# Patient Record
Sex: Female | Born: 1975 | Hispanic: No | Marital: Single | Smoking: Former smoker
Health system: Southern US, Community
[De-identification: ages and names within clinical notes are randomized; demographics above are authoritative.]

## PROBLEM LIST (undated history)

## (undated) DIAGNOSIS — N920 Excessive and frequent menstruation with regular cycle: Secondary | ICD-10-CM

## (undated) HISTORY — PX: OTHER SURGICAL HISTORY: SHX169

---

## 1999-06-25 ENCOUNTER — Ambulatory Visit (HOSPITAL_COMMUNITY): Admission: RE | Admit: 1999-06-25 | Discharge: 1999-06-25 | Payer: Self-pay | Admitting: *Deleted

## 1999-10-14 ENCOUNTER — Inpatient Hospital Stay (HOSPITAL_COMMUNITY): Admission: AD | Admit: 1999-10-14 | Discharge: 1999-10-14 | Payer: Self-pay | Admitting: *Deleted

## 1999-11-21 ENCOUNTER — Inpatient Hospital Stay (HOSPITAL_COMMUNITY): Admission: AD | Admit: 1999-11-21 | Discharge: 1999-11-23 | Payer: Self-pay | Admitting: Obstetrics & Gynecology

## 2000-02-07 ENCOUNTER — Emergency Department (HOSPITAL_COMMUNITY): Admission: EM | Admit: 2000-02-07 | Discharge: 2000-02-07 | Payer: Self-pay | Admitting: *Deleted

## 2006-07-12 ENCOUNTER — Other Ambulatory Visit: Admission: RE | Admit: 2006-07-12 | Discharge: 2006-07-12 | Payer: Self-pay | Admitting: Gynecology

## 2007-12-14 ENCOUNTER — Encounter: Payer: Self-pay | Admitting: Women's Health

## 2007-12-14 ENCOUNTER — Ambulatory Visit: Payer: Self-pay | Admitting: Women's Health

## 2007-12-14 ENCOUNTER — Other Ambulatory Visit: Admission: RE | Admit: 2007-12-14 | Discharge: 2007-12-14 | Payer: Self-pay | Admitting: Gynecology

## 2008-01-16 ENCOUNTER — Ambulatory Visit: Payer: Self-pay | Admitting: Women's Health

## 2009-01-04 ENCOUNTER — Other Ambulatory Visit: Admission: RE | Admit: 2009-01-04 | Discharge: 2009-01-04 | Payer: Self-pay | Admitting: Gynecology

## 2009-01-04 ENCOUNTER — Ambulatory Visit: Payer: Self-pay | Admitting: Women's Health

## 2009-01-11 ENCOUNTER — Ambulatory Visit: Payer: Self-pay | Admitting: Women's Health

## 2010-01-08 ENCOUNTER — Other Ambulatory Visit
Admission: RE | Admit: 2010-01-08 | Discharge: 2010-01-08 | Payer: Self-pay | Source: Home / Self Care | Admitting: Gynecology

## 2010-01-08 ENCOUNTER — Ambulatory Visit: Payer: Self-pay | Admitting: Women's Health

## 2010-07-22 ENCOUNTER — Ambulatory Visit (HOSPITAL_BASED_OUTPATIENT_CLINIC_OR_DEPARTMENT_OTHER)
Admission: RE | Admit: 2010-07-22 | Discharge: 2010-07-22 | Disposition: A | Discharge status: Home / Self Care | Payer: Managed Care, Other (non HMO) | Source: Ambulatory Visit | Attending: Orthopedic Surgery | Admitting: Orthopedic Surgery

## 2010-07-22 DIAGNOSIS — M6740 Ganglion, unspecified site: Secondary | ICD-10-CM | POA: Insufficient documentation

## 2010-07-22 DIAGNOSIS — Z01812 Encounter for preprocedural laboratory examination: Secondary | ICD-10-CM | POA: Insufficient documentation

## 2010-07-22 LAB — POCT HEMOGLOBIN-HEMACUE: Hemoglobin: 14.4 g/dL (ref 12.0–15.0)

## 2010-07-24 NOTE — Op Note (Signed)
NAMEDIEGO, Tabitha Mcconnell                 ACCOUNT NO.:  [id number]  MEDICAL RECORD NO.:  [id number]  LOCATION:                                 FACILITY:  PHYSICIAN:  Katy Fitch. Danie Hannig, M.D. DATE OF BIRTH:  February 04, 1975  DATE OF PROCEDURE:  07/22/2010 DATE OF DISCHARGE:                              OPERATIVE REPORT   PREOPERATIVE DIAGNOSIS:  Complex dorsal myxoid cyst, right wrist.  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  OPERATION:  Resection of complex myxoid cyst, dorsal aspect of right wrist with removal of extra retinacular component, sub retinacular component, and intra-articular component.  OPERATING SURGEON:  Katy Fitch. Iara Monds, MD  ASSISTANT:  Marveen Reeks Dasnoit, PA-C  ANESTHESIA:  General by LMA.  SUPERVISING ANESTHESIOLOGIST:  Janetta Hora. Gelene Mink, MD  INDICATIONS:  Tabitha Mcconnell is a 35 year old woman employed by [employer].  She presented for evaluation and management of a mass on dorsal aspect of the wrist, it was causing discomfort.  She appeared to have a relatively soft myxoid cyst that was both extra retinacular and sub retinacular.  We advised observation versus excision.  She requested excision.  Preoperatively, she was carefully counseled with her mother present that we could not guarantee that she would never have another cyst develop in this area.  These are degenerative in nature and can reoccur despite very thorough debridement.  After informed consent, she is brought to the operating room at this time.  PROCEDURE:  Tabitha Mcconnell is brought to room 6 [facility] and placed in a supine position on the operating table.  Following the induction of general anesthesia by LMA technique, the right arm was prepped with Betadine soap solution, sterilely draped.  A pneumatic tourniquet was applied to the proximal right brachium.  On exsanguination of the right arm with  Esmarch bandage, arterial tourniquet was inflated to 220 mmHg.  Procedure commenced with a routine surgical time-out.  A transverse incision was fashioned directly over the mass.  Subcutaneous tissues were carefully divided taking care to carefully protect the dorsal radial sensory branches.  The cyst was noted to be exiting between the fibers of the extensor retinaculum. These were split in line of its fibers and the cyst followed back between the second and fourth dorsal compartments.  The extra-articular portion of the cyst was carefully preserved as we followed the sub retinacular portion down to the dorsal wrist capsule.  The cyst was amputated at the dorsal aspect of the wrist followed by arthrotomy directly over the scapholunate ligament.  A second very significant cyst measuring 1 cm diameter was found intra-articular.  This was essentially a bilobed cyst with a long component dissecting along the vein through the capsule and retinaculum and second component within the wrist joint.  The scapholunate ligament was cleaned with a rongeur.  No mass or other predicaments were noted.  The region was carefully inspected and palpated looking for other lobes of the cyst.  None were identified.  The wound was then repaired in layers with 4-0 Vicryl subcutaneous suture and intradermal 3-0 Prolene with Steri-Strips.  2% lidocaine was infiltrated for postop analgesia.  For aftercare, Tabitha Mcconnell is provided prescription for Vicodin 5 mg one p.o. q.4-6 h. p.r.n. pain, 20 tablets without refill.  We will see Tabitha Mcconnell back for followup in our office in 1 week for dressing change, suture removal, and advancement to a postoperative rehab program.     Katy Fitch. Chon Buhl, M.D.     RVS/MEDQ  D:  07/22/2010  T:  07/23/2010  Job:  [id number]  Electronically Signed by Josephine Igo M.D. on 07/24/2010 02:09:02 PM

## 2011-01-08 ENCOUNTER — Encounter: Payer: Self-pay | Admitting: Anesthesiology

## 2011-01-14 ENCOUNTER — Encounter: Payer: Managed Care, Other (non HMO) | Admitting: Women's Health

## 2011-02-02 ENCOUNTER — Ambulatory Visit (INDEPENDENT_AMBULATORY_CARE_PROVIDER_SITE_OTHER): Payer: Managed Care, Other (non HMO) | Admitting: Women's Health

## 2011-02-02 ENCOUNTER — Encounter: Payer: Self-pay | Admitting: Women's Health

## 2011-02-02 VITALS — BP 124/80 | Ht 61.0 in | Wt 199.0 lb

## 2011-02-02 DIAGNOSIS — Z833 Family history of diabetes mellitus: Secondary | ICD-10-CM

## 2011-02-02 DIAGNOSIS — Z01419 Encounter for gynecological examination (general) (routine) without abnormal findings: Secondary | ICD-10-CM

## 2011-02-02 DIAGNOSIS — Z13220 Encounter for screening for lipoid disorders: Secondary | ICD-10-CM

## 2011-02-02 DIAGNOSIS — Z23 Encounter for immunization: Secondary | ICD-10-CM

## 2011-02-02 DIAGNOSIS — IMO0001 Reserved for inherently not codable concepts without codable children: Secondary | ICD-10-CM

## 2011-02-02 DIAGNOSIS — Z309 Encounter for contraceptive management, unspecified: Secondary | ICD-10-CM

## 2011-02-02 LAB — URINALYSIS
Bilirubin Urine: NEGATIVE
Glucose, UA: NEGATIVE mg/dL
Leukocytes, UA: NEGATIVE
Protein, ur: NEGATIVE mg/dL
pH: 5.5 (ref 5.0–8.0)

## 2011-02-02 LAB — LIPID PANEL
Cholesterol: 156 mg/dL (ref 0–200)
Total CHOL/HDL Ratio: 3.5 Ratio

## 2011-02-02 LAB — GLUCOSE, RANDOM: Glucose, Bld: 81 mg/dL (ref 70–99)

## 2011-02-02 MED ORDER — MEDROXYPROGESTERONE ACETATE 150 MG/ML IM SUSP: 150.0000 mg | INTRAMUSCULAR | Status: DC

## 2011-02-02 NOTE — Progress Notes (Signed)
Tabitha Mcconnell 37/12/1975 [id number]    History:    The patient presents for annual exam.  Amenorrheic on Depo-Provera without complaint/same partner. Normal DEXA in 2009. Quit smoking October 2012. History of all normal Paps.   Past medical history, past surgical history, family history and social history were all reviewed and documented in the EPIC chart. 37 year old son Heloise Purpura, doing well.  ROS:  A  ROS was performed and pertinent positives and negatives are included in the history.  Exam:  Filed Vitals:   02/02/11 1011  BP: 124/80    General appearance:  Normal Head/Neck:  Normal, without cervical or supraclavicular adenopathy. Thyroid:  Symmetrical, normal in size, without palpable masses or nodularity. Respiratory  Effort:  Normal  Auscultation:  Clear without wheezing or rhonchi Cardiovascular  Auscultation:  Regular rate, without rubs, murmurs or gallops  Edema/varicosities:  Not grossly evident Abdominal  Soft,nontender, without masses, guarding or rebound.  Liver/spleen:  No organomegaly noted  Hernia:  None appreciated  Skin  Inspection:  Grossly normal  Palpation:  Grossly normal Neurologic/psychiatric  Orientation:  Normal with appropriate conversation.  Mood/affect:  Normal  Genitourinary    Breasts: Examined lying and sitting.     Right: Without masses, retractions, discharge or axillary adenopathy.     Left: Without masses, retractions, discharge or axillary adenopathy.   Inguinal/mons:  Normal without inguinal adenopathy  External genitalia:  Normal  BUS/Urethra/Skene's glands:  Normal  Bladder:  Normal  Vagina:  Normal  Cervix:  Normal  Uterus:   normal in size, shape and contour.  Midline and mobile  Adnexa/parametria:     Rt: Without masses or tenderness.   Lt: Without masses or tenderness.  Anus and perineum: Normal  Digital rectal exam: Normal sphincter tone without palpated masses or tenderness  Assessment/Plan:  36 y.o. SBF G1 P1 for annual  exam.   Amenorrheic on Depo-Provera Obese  Plan: Denied request for phentermine. Encouraged Weight Watchers for weight loss, nutrition counseling, and to increase exercise decrease calories for weight loss. SBEs, calcium rich diet, MVI daily. Depo-Provera 150 every 12 weeks IM, prescription, proper use, reviewed importance of a calcium rich diet. CBC, glucose, lipid profile, UA. Please vaccine given   Harrington Challenger [facility], 12:27 PM 02/02/2011

## 2011-02-03 LAB — CBC WITH DIFFERENTIAL/PLATELET
Basophils Absolute: 0 10*3/uL (ref 0.0–0.1)
Basophils Relative: 1 % (ref 0–1)
Eosinophils Absolute: 0.1 10*3/uL (ref 0.0–0.7)
Eosinophils Relative: 1 % (ref 0–5)
HCT: 42.9 % (ref 36.0–46.0)
Hemoglobin: 14.2 g/dL (ref 12.0–15.0)
Lymphocytes Relative: 36 % (ref 12–46)
Lymphs Abs: 2.7 10*3/uL (ref 0.7–4.0)
MCH: 29.6 pg (ref 26.0–34.0)
MCHC: 33.1 g/dL (ref 30.0–36.0)
MCV: 89.4 fL (ref 78.0–100.0)
Monocytes Absolute: 0.7 10*3/uL (ref 0.1–1.0)
Monocytes Relative: 9 % (ref 3–12)
Neutro Abs: 4 10*3/uL (ref 1.7–7.7)
Neutrophils Relative %: 53 % (ref 43–77)
Platelets: 293 10*3/uL (ref 150–400)
RBC: 4.8 MIL/uL (ref 3.87–5.11)
RDW: 13.8 % (ref 11.5–15.5)
WBC: 7.6 10*3/uL (ref 4.0–10.5)

## 2011-02-08 ENCOUNTER — Other Ambulatory Visit: Payer: Self-pay | Admitting: Women's Health

## 2011-11-12 ENCOUNTER — Emergency Department (HOSPITAL_COMMUNITY): Payer: Managed Care, Other (non HMO)

## 2011-11-12 ENCOUNTER — Emergency Department (HOSPITAL_COMMUNITY)
Admission: EM | Admit: 2011-11-12 | Discharge: 2011-11-12 | Disposition: A | Discharge status: Home / Self Care | Payer: Managed Care, Other (non HMO) | Attending: Emergency Medicine | Admitting: Emergency Medicine

## 2011-11-12 ENCOUNTER — Encounter (HOSPITAL_COMMUNITY): Payer: Self-pay | Admitting: Emergency Medicine

## 2011-11-12 ENCOUNTER — Telehealth: Payer: Self-pay | Admitting: Women's Health

## 2011-11-12 DIAGNOSIS — K8020 Calculus of gallbladder without cholecystitis without obstruction: Secondary | ICD-10-CM

## 2011-11-12 DIAGNOSIS — Z87891 Personal history of nicotine dependence: Secondary | ICD-10-CM | POA: Insufficient documentation

## 2011-11-12 DIAGNOSIS — R1011 Right upper quadrant pain: Secondary | ICD-10-CM | POA: Insufficient documentation

## 2011-11-12 LAB — COMPREHENSIVE METABOLIC PANEL
ALT: 18 U/L (ref 0–35)
Alkaline Phosphatase: 126 U/L — ABNORMAL HIGH (ref 39–117)
CO2: 24 mEq/L (ref 19–32)
Calcium: 9.5 mg/dL (ref 8.4–10.5)
GFR calc Af Amer: >90 mL/min (ref >90)
GFR calc non Af Amer: >90 mL/min (ref >90)
Glucose, Bld: 117 mg/dL — ABNORMAL HIGH (ref 70–99)
Potassium: 3.8 mEq/L (ref 3.5–5.1)
Sodium: 139 mEq/L (ref 135–145)

## 2011-11-12 LAB — URINALYSIS, ROUTINE W REFLEX MICROSCOPIC
Glucose, UA: NEGATIVE mg/dL
Nitrite: NEGATIVE
Specific Gravity, Urine: 1.02 (ref 1.005–1.030)
pH: 8.5 — ABNORMAL HIGH (ref 5.0–8.0)

## 2011-11-12 LAB — CBC WITH DIFFERENTIAL/PLATELET
Eosinophils Relative: 1 % (ref 0–5)
HCT: 41.3 % (ref 36.0–46.0)
Hemoglobin: 14.4 g/dL (ref 12.0–15.0)
Lymphocytes Relative: 24 % (ref 12–46)
Lymphs Abs: 2.4 10*3/uL (ref 0.7–4.0)
MCV: 87.9 fL (ref 78.0–100.0)
Platelets: 281 10*3/uL (ref 150–400)
RBC: 4.7 MIL/uL (ref 3.87–5.11)
WBC: 10.2 10*3/uL (ref 4.0–10.5)

## 2011-11-12 MED ORDER — HYDROCODONE-ACETAMINOPHEN 5-500 MG PO TABS: 1.0000 | ORAL_TABLET | Freq: Four times a day (QID) | ORAL | Status: DC | PRN

## 2011-11-12 NOTE — ED Notes (Signed)
PT. REPORTS RUQ PAIN ONSET LAST NIGHT , DENIES NAUSEA/VOMITTING OR DIARRHEA , NO FEVER OR CHILLS.

## 2011-11-12 NOTE — Telephone Encounter (Signed)
Telephone call to patient - had been seen in the ER last night for right upper quadrant pain, diagnosed with gallstones. Has scheduled followup with [facility] today.

## 2011-11-12 NOTE — ED Notes (Signed)
Rx given x1 Pt ambulating independently w/ steady gait on d/c in no acute distress, A&Ox4. D/c instructions reviewed w/ pt - pt denies any further questions or concerns at present.   

## 2011-11-12 NOTE — ED Provider Notes (Signed)
History     CSN: [id number]  Arrival date & time 11/12/11  0011   First MD Initiated Contact with Patient 11/12/11 0122      Chief Complaint  Patient presents with  . Abdominal Pain    (Consider location/radiation/quality/duration/timing/severity/associated sxs/prior treatment) Patient is a 36 y.o. female presenting with abdominal pain. The history is provided by the patient.  Abdominal Pain The primary symptoms of the illness include abdominal pain. The primary symptoms of the illness do not include fever, shortness of breath, nausea, vomiting, diarrhea or dysuria. The current episode started yesterday. The onset of the illness was gradual. The problem has been gradually worsening.  The abdominal pain is located in the RUQ. The abdominal pain does not radiate. The severity of the abdominal pain is 6/10. The abdominal pain is relieved by nothing. Exacerbated by: lying down.  Associated with: no alcohol or nsaid use. Additional symptoms associated with the illness include anorexia. Symptoms associated with the illness do not include chills, constipation, urgency, frequency or back pain. Significant associated medical issues do not include gallstones.    Past Medical History  Diagnosis Date  . Smoker     3-4 cig a day QUIT  10/12    Past Surgical History  Procedure Date  . Wrist surgery 06-2010    CYST REMOVED FROM RIGHT WRIST    Family History  Problem Relation Age of Onset  . Heart disease Father   . Breast cancer Paternal Aunt     died in 79's  . Cancer Paternal Uncle     deceased    History  Substance Use Topics  . Smoking status: Former Smoker -- 0.2 packs/day    Types: Cigarettes  . Smokeless tobacco: Never Used  . Alcohol Use: Yes     socially    OB History    Grav Para Term Preterm Abortions TAB SAB Ect Mult Living   1 1        1       Review of Systems  Constitutional: Negative for fever and chills.  Respiratory: Negative for shortness of breath.     Gastrointestinal: Positive for abdominal pain and anorexia. Negative for nausea, vomiting, diarrhea and constipation.  Genitourinary: Negative for dysuria, urgency and frequency.  Musculoskeletal: Negative for back pain.  All other systems reviewed and are negative.    Allergies  Review of patient's allergies indicates no known allergies.  Home Medications   Current Outpatient Rx  Name Route Sig Dispense Refill  . MEDROXYPROGESTERONE ACETATE 150 MG/ML IM SUSP Intramuscular Inject 1 mL (150 mg total) into the muscle every 3 (three) months. 1 mL 4  . PHENTERMINE HCL 37.5 MG PO CAPS Oral Take 37.5 mg by mouth daily with breakfast.      BP 133/76  Pulse 98  Temp 98.1 F (36.7 C) (Oral)  Resp 18  SpO2 99%  Physical Exam  Nursing note and vitals reviewed. Constitutional: She is oriented to person, place, and time. She appears well-developed and well-nourished. No distress.  HENT:  Head: Normocephalic and atraumatic.  Mouth/Throat: Oropharynx is clear and moist.  Eyes: Conjunctivae normal and EOM are normal. Pupils are equal, round, and reactive to light.  Neck: Normal range of motion. Neck supple.  Cardiovascular: Normal rate, regular rhythm and intact distal pulses.   No murmur heard. Pulmonary/Chest: Effort normal and breath sounds normal. No respiratory distress. She has no wheezes. She has no rales.  Abdominal: Soft. Normal appearance. She exhibits no distension. There is tenderness  in the right upper quadrant. There is no rebound and no guarding.  Musculoskeletal: Normal range of motion. She exhibits no edema and no tenderness.  Neurological: She is alert and oriented to person, place, and time.  Skin: Skin is warm and dry. No rash noted. No erythema.  Psychiatric: She has a normal mood and affect. Her behavior is normal.    ED Course  Procedures (including critical care time)  Labs Reviewed  URINALYSIS, ROUTINE W REFLEX MICROSCOPIC - Abnormal; Notable for the  following:    APPearance CLOUDY (*)     pH 8.5 (*)     Leukocytes, UA TRACE (*)     All other components within normal limits  COMPREHENSIVE METABOLIC PANEL - Abnormal; Notable for the following:    Glucose, Bld 117 (*)     Alkaline Phosphatase 126 (*)     All other components within normal limits  URINE MICROSCOPIC-ADD ON - Abnormal; Notable for the following:    Squamous Epithelial / LPF FEW (*)     Bacteria, UA FEW (*)     All other components within normal limits  CBC WITH DIFFERENTIAL  POCT PREGNANCY, URINE  LIPASE, BLOOD   US Abdomen Complete  11/12/2011  *RADIOLOGY REPORT*  Clinical Data:  Right upper quadrant pain.  COMPLETE ABDOMINAL ULTRASOUND  Comparison:  None.  Findings:  Gallbladder:  Multiple stones in the gallbladder measuring up to 1.4 cm.  Sludge in the gallbladder.  No significant gallbladder wall thickening.  Ring down artifact consistent with adenomyomatosis.Murphy's sign is negative.  Common bile duct:  Normal caliber with measured diameter of 3.3 mm.  Liver:  No focal lesion identified.  Within normal limits in parenchymal echogenicity.  IVC:  Appears normal.  Pancreas:  Visualized head and body of the pancreas are unremarkable.  Spleen:  Spleen length measures 9.7 cm.  Normal parenchymal echotexture.  Right Kidney:  Right kidney measures 10.2 cm length.  No hydronephrosis.  Left Kidney:  Left kidney measures 11 cm length.  No hydronephrosis.  Abdominal aorta:  No aneurysm identified.  IMPRESSION: Cholelithiasis with gallbladder sludge.  Gallbladder adenomyomatosis.   Original Report Authenticated By: Marlon Pel, M.D.      No diagnosis found.    MDM   Patient with a history of abdominal pain that started 2 days ago. And improved and then returned tonight. She denies any association with eating or any associated symptoms. On exam she has significant right upper quadrant tenderness. Concern for possible cholecystitis. Ultrasound pending. CBC, CMP, lipase, UA  are all within normal limits. Patient denies needing any medication for pain at this time.  3:22 AM On reevaluation patient is completely pain-free without any medications. Ultrasound shows cholelithiasis and gallbladder sludge. This is most likely the cause of her symptoms will get outpatient followup to surgery.       Gwyneth Sprout, MD 11/12/11 [phone]

## 2011-11-12 NOTE — ED Notes (Signed)
Pt back from US

## 2011-11-30 ENCOUNTER — Ambulatory Visit (INDEPENDENT_AMBULATORY_CARE_PROVIDER_SITE_OTHER): Payer: Managed Care, Other (non HMO) | Admitting: Surgery

## 2011-11-30 ENCOUNTER — Encounter (INDEPENDENT_AMBULATORY_CARE_PROVIDER_SITE_OTHER): Payer: Self-pay | Admitting: Surgery

## 2011-11-30 VITALS — BP 124/82 | HR 76 | Temp 97.8°F | Resp 14 | Ht 62.0 in | Wt 198.8 lb

## 2011-11-30 DIAGNOSIS — K8020 Calculus of gallbladder without cholecystitis without obstruction: Secondary | ICD-10-CM | POA: Insufficient documentation

## 2011-11-30 NOTE — Patient Instructions (Signed)
Laparoscopic Cholecystectomy Laparoscopic cholecystectomy is surgery to remove the gallbladder. The gallbladder is located slightly to the right of center in the abdomen, behind the liver. It is a concentrating and storage sac for the bile produced in the liver. Bile aids in the digestion and absorption of fats. Gallbladder disease (cholecystitis) is an inflammation of your gallbladder. This condition is usually caused by a buildup of gallstones (cholelithiasis) in your gallbladder. Gallstones can block the flow of bile, resulting in inflammation and pain. In severe cases, emergency surgery may be required. When emergency surgery is not required, you will have time to prepare for the procedure. Laparoscopic surgery is an alternative to open surgery. Laparoscopic surgery usually has a shorter recovery time. Your common bile duct may also need to be examined and explored. Your caregiver will discuss this with you if he or she feels this should be done. If stones are found in the common bile duct, they may be removed. LET YOUR CAREGIVER KNOW ABOUT:  Allergies to food or medicine.  Medicines taken, including vitamins, herbs, eyedrops, over-the-counter medicines, and creams.  Use of steroids (by mouth or creams).  Previous problems with anesthetics or numbing medicines.  History of bleeding problems or blood clots.  Previous surgery.  Other health problems, including diabetes and kidney problems.  Possibility of pregnancy, if this applies. RISKS AND COMPLICATIONS All surgery is associated with risks. Some problems that may occur following this procedure include:  Infection.  Damage to the common bile duct, nerves, arteries, veins, or other internal organs such as the stomach or intestines.  Bleeding.  A stone may remain in the common bile duct. BEFORE THE PROCEDURE  Do not take aspirin for 3 days prior to surgery or blood thinners for 1 week prior to surgery.  Do not eat or drink  anything after midnight the night before surgery.  Let your caregiver know if you develop a cold or other infectious problem prior to surgery.  You should be present 60 minutes before the procedure or as directed. PROCEDURE  You will be given medicine that makes you sleep (general anesthetic). When you are asleep, your surgeon will make several small cuts (incisions) in your abdomen. One of these incisions is used to insert a small, lighted scope (laparoscope) into the abdomen. The laparoscope helps the surgeon see into your abdomen. Carbon dioxide gas will be pumped into your abdomen. The gas allows more room for the surgeon to perform your surgery. Other operating instruments are inserted through the other incisions. Laparoscopic procedures may not be appropriate when:  There is major scarring from previous surgery.  The gallbladder is extremely inflamed.  There are bleeding disorders or unexpected cirrhosis of the liver.  A pregnancy is near term.  Other conditions make the laparoscopic procedure impossible. If your surgeon feels it is not safe to continue with a laparoscopic procedure, he or she will perform an open abdominal procedure. In this case, the surgeon will make an incision to open the abdomen. This gives the surgeon a larger view and field to work within. This may allow the surgeon to perform procedures that sometimes cannot be performed with a laparoscope alone. Open surgery has a longer recovery time. AFTER THE PROCEDURE  You will be taken to the recovery area where a nurse will watch and check your progress.  You may be allowed to go home the same day.  Do not resume physical activities until directed by your caregiver.  You may resume a normal diet and   activities as directed. Document Released: 01/12/2005 Document Revised: 04/06/2011 Document Reviewed: 06/27/2010 [facility] Patient Information 2013 [place], [place].  Cholelithiasis Cholelithiasis (also called  gallstones) is a form of gallbladder disease where gallstones form in your gallbladder. The gallbladder is a non-essential organ that stores bile made in the liver, which helps digest fats. Gallstones begin as small crystals and slowly grow into stones. Gallstone pain occurs when the gallbladder spasms, and a gallstone is blocking the duct. Pain can also occur when a stone passes out of the duct.  Women are more likely to develop gallstones than men. Other factors that increase the risk of gallbladder disease are:  Having multiple pregnancies. Physicians sometimes advise removing diseased gallbladders before future pregnancies.  Obesity.  Diets heavy in fried foods and fat.  Increasing age (older than 14).  Prolonged use of medications containing female hormones.  Diabetes mellitus.  Rapid weight loss.  Family history of gallstones (heredity). SYMPTOMS  Feeling sick to your stomach (nauseous).  Abdominal pain.  Yellowing of the skin (jaundice).  Sudden pain. It may persist from several minutes to several hours.  Worsening pain with deep breathing or when jarred.  Fever.  Tenderness to the touch. In some cases, when gallstones do not move into the bile duct, people have no pain or symptoms. These are called "silent" gallstones. TREATMENT In severe cases, emergency surgery may be required. HOME CARE INSTRUCTIONS   Only take over-the-counter or prescription medicines for pain, discomfort, or fever as directed by your caregiver.  Follow a low-fat diet until seen again. Fat causes the gallbladder to contract, which can result in pain.  Follow up as instructed. Attacks are almost always recurrent and surgery is usually required for permanent treatment. SEEK IMMEDIATE MEDICAL CARE IF:   Your pain increases and is not controlled by medications.  You have an oral temperature above 102 F (38.9 C), not controlled by medication.  You develop nausea and vomiting. MAKE SURE YOU:    Understand these instructions.  Will watch your condition.  Will get help right away if you are not doing well or get worse. Document Released: 01/08/2005 Document Revised: 04/06/2011 Document Reviewed: 03/13/2010 [facility] Patient Information 2013 [place], [place]. Laparoscopic Cholecystectomy Care After These instructions give you information on caring for yourself after your procedure. Your doctor may also give you more specific instructions. Call your doctor if you have any problems or questions after your procedure. HOME CARE  Change your bandages (dressings) as told by your doctor.  Keep the wound dry and clean. Wash the wound gently with soap and water. Pat the wound dry with a clean towel.  Do not take baths, swim, or use hot tubs for 10 days, or as told by your doctor.  Only take medicine as told by your doctor.  Eat a normal diet as told by your doctor.  Do not lift anything heavier than 25 pounds (11.5 kg), or as told by your doctor.  Do not play contact sports for 1 week, or as told by your doctor. GET HELP RIGHT AWAY IF:   Your wound is red, puffy (swollen), or painful.  You have yellowish-white fluid (pus) coming from the wound.  You have fluid draining from the wound for more than 1 day.  You have a bad smell coming from the wound.  Your wound breaks open.  You have a rash.  You have trouble breathing.  You have chest pain.  You have a bad reaction to your medicine.  You have a  fever.  You have pain in the shoulders (shoulder strap areas).  You feel dizzy or pass out (faint).  You have severe belly (abdominal) pain.  You feel sick to your stomach (nauseous) or throw up (vomit) for more than 1 day. MAKE SURE YOU:  Understand these instructions.  Will watch your condition.  Will get help right away if you are not doing well or get worse. Document Released: 10/22/2007 Document Revised: 04/06/2011 Document Reviewed: 07/01/2010 [facility]  Patient Information 2013 [place], [place].

## 2011-11-30 NOTE — Progress Notes (Signed)
Patient ID: Tabitha Mcconnell, female   DOB: 01-28-1975, 36 y.o.   MRN: [id number]  No chief complaint on file.   HPI Tabitha Mcconnell is a 36 y.o. female.  Patient sent at the request of Dr. Anitra Lauth for right upper quadrant abdominal pain. She was seen 2 weeks ago in the emergency room. She had a one-day history of right upper quadrant abdominal pain. This came on suddenly. This was severe. This was sharp in nature. Radiation to her back. No dietary trigger. Ultrasound showed gallstones. HPI  Past Medical History  Diagnosis Date  . Smoker     3-4 cig a day QUIT  10/12    Past Surgical History  Procedure Date  . Wrist surgery 06-2010    CYST REMOVED FROM RIGHT WRIST    Family History  Problem Relation Age of Onset  . Heart disease Father   . Breast cancer Paternal Aunt     died in 85's  . Cancer Paternal Uncle     deceased  . Lymphoma Cousin     Social History History  Substance Use Topics  . Smoking status: Former Smoker -- 0.2 packs/day    Types: Cigarettes  . Smokeless tobacco: Never Used  . Alcohol Use: Yes     Comment: socially    No Known Allergies  Current Outpatient Prescriptions  Medication Sig Dispense Refill  . HYDROcodone-acetaminophen (VICODIN) 5-500 MG per tablet Take 1-2 tablets by mouth every 6 (six) hours as needed for pain.  15 tablet  0  . medroxyPROGESTERone (DEPO-PROVERA) 150 MG/ML injection Inject 1 mL (150 mg total) into the muscle every 3 (three) months.  1 mL  4  . phentermine 37.5 MG capsule Take 37.5 mg by mouth daily with breakfast.        Review of Systems Review of Systems  Constitutional: Negative for fever, chills and unexpected weight change.  HENT: Negative for hearing loss, congestion, sore throat, trouble swallowing and voice change.   Eyes: Negative for visual disturbance.  Respiratory: Negative for cough and wheezing.   Cardiovascular: Negative for chest pain, palpitations and leg swelling.  Gastrointestinal: Negative for nausea,  vomiting, abdominal pain, diarrhea, constipation, blood in stool, abdominal distention and anal bleeding.  Genitourinary: Negative for hematuria, vaginal bleeding and difficulty urinating.  Musculoskeletal: Negative for arthralgias.  Skin: Negative for rash and wound.  Neurological: Negative for seizures, syncope and headaches.  Hematological: Negative for adenopathy. Does not bruise/bleed easily.  Psychiatric/Behavioral: Negative for confusion.    Blood pressure 124/82, pulse 76, temperature 97.8 F (36.6 C), resp. rate 14, height 5\' 2"  (1.575 m), weight 198 lb 12.8 oz (90.175 kg).  Physical Exam Physical Exam  Constitutional: She is oriented to person, place, and time. She appears well-developed and well-nourished.  HENT:  Head: Normocephalic and atraumatic.  Eyes: EOM are normal. Pupils are equal, round, and reactive to light.  Neck: Normal range of motion. Neck supple.  Cardiovascular: Normal rate and regular rhythm.   Pulmonary/Chest: Effort normal and breath sounds normal.  Abdominal: Soft. Bowel sounds are normal. She exhibits no distension. There is no tenderness. There is no rebound and no guarding.  Musculoskeletal: Normal range of motion.  Neurological: She is alert and oriented to person, place, and time.  Skin: Skin is warm and dry.  Psychiatric: She has a normal mood and affect. Her behavior is normal. Judgment and thought content normal.    Data Reviewed *RADIOLOGY REPORT*  Clinical Data: Right upper quadrant pain.  COMPLETE ABDOMINAL  ULTRASOUND  Comparison: None.  Findings:  Gallbladder: Multiple stones in the gallbladder measuring up to  1.4 cm. Sludge in the gallbladder. No significant gallbladder  wall thickening. Ring down artifact consistent with  adenomyomatosis.Murphy's sign is negative.  Common bile duct: Normal caliber with measured diameter of 3.3 mm.  Liver: No focal lesion identified. Within normal limits in  parenchymal echogenicity.  IVC: Appears  normal.  Pancreas: Visualized head and body of the pancreas are  unremarkable.  Spleen: Spleen length measures 9.7 cm. Normal parenchymal  echotexture.  Right Kidney: Right kidney measures 10.2 cm length. No  hydronephrosis.  Left Kidney: Left kidney measures 11 cm length. No  hydronephrosis.  Abdominal aorta: No aneurysm identified.  IMPRESSION:  Cholelithiasis with gallbladder sludge. Gallbladder  adenomyomatosis.  Original Report Authenticated By: Marlon Pel, M.D.    Assessment    Symptomatic cholelithiasis    Plan    Laparoscopic cholecystectomy with cholangiogram.The procedure has been discussed with the patient. Operative and non operative treatments have been discussed. Risks of surgery include bleeding, infection,  Common bile duct injury,  Injury to the stomach,liver, colon,small intestine, abdominal wall,  Diaphragm,  Major blood vessels,  And the need for an open procedure.  Other risks include worsening of medical problems, death,  DVT and pulmonary embolism, and cardiovascular events.   Medical options have also been discussed. The patient has been informed of long term expectations of surgery and non surgical options,  The patient agrees to proceed.          Hence Derrick A. 11/30/2011, 10:27 AM

## 2011-12-27 HISTORY — PX: LAPAROSCOPIC CHOLECYSTECTOMY: SUR755

## 2012-01-07 ENCOUNTER — Other Ambulatory Visit (INDEPENDENT_AMBULATORY_CARE_PROVIDER_SITE_OTHER): Payer: Self-pay | Admitting: Surgery

## 2012-01-07 ENCOUNTER — Ambulatory Visit
Admission: RE | Admit: 2012-01-07 | Discharge: 2012-01-07 | Disposition: A | Discharge status: Home / Self Care | Payer: Managed Care, Other (non HMO) | Source: Ambulatory Visit | Attending: Surgery | Admitting: Surgery

## 2012-01-07 DIAGNOSIS — Z01811 Encounter for preprocedural respiratory examination: Secondary | ICD-10-CM

## 2012-01-12 ENCOUNTER — Other Ambulatory Visit (INDEPENDENT_AMBULATORY_CARE_PROVIDER_SITE_OTHER): Payer: Self-pay | Admitting: Surgery

## 2012-01-12 DIAGNOSIS — K8010 Calculus of gallbladder with chronic cholecystitis without obstruction: Secondary | ICD-10-CM

## 2012-01-29 ENCOUNTER — Encounter (INDEPENDENT_AMBULATORY_CARE_PROVIDER_SITE_OTHER): Payer: Self-pay | Admitting: Surgery

## 2012-01-29 ENCOUNTER — Ambulatory Visit (INDEPENDENT_AMBULATORY_CARE_PROVIDER_SITE_OTHER): Payer: Managed Care, Other (non HMO) | Admitting: Surgery

## 2012-01-29 VITALS — BP 124/84 | HR 80 | Temp 98.3°F | Resp 14 | Ht 62.0 in | Wt 204.8 lb

## 2012-01-29 DIAGNOSIS — Z9889 Other specified postprocedural states: Secondary | ICD-10-CM

## 2012-01-29 NOTE — Progress Notes (Signed)
she is here for a postop visit following laparoscopic cholecystectomy.  Diet is being tolerated, bowels are moving.  No problems with incisions.  PE:  ABD:  Soft, incisions clean/dry/intact and solid.  Assessment:  Doing well postop.  Plan:  Lowfat diet recommended.  Activities as tolerated.  Return visit prn. 

## 2012-01-29 NOTE — Patient Instructions (Signed)
Return to full activity.  

## 2012-02-04 ENCOUNTER — Ambulatory Visit (INDEPENDENT_AMBULATORY_CARE_PROVIDER_SITE_OTHER): Payer: Managed Care, Other (non HMO) | Admitting: Women's Health

## 2012-02-04 ENCOUNTER — Encounter: Payer: Self-pay | Admitting: Women's Health

## 2012-02-04 VITALS — BP 120/82 | Ht 61.0 in | Wt 191.0 lb

## 2012-02-04 DIAGNOSIS — IMO0001 Reserved for inherently not codable concepts without codable children: Secondary | ICD-10-CM

## 2012-02-04 DIAGNOSIS — K8020 Calculus of gallbladder without cholecystitis without obstruction: Secondary | ICD-10-CM

## 2012-02-04 DIAGNOSIS — Z309 Encounter for contraceptive management, unspecified: Secondary | ICD-10-CM

## 2012-02-04 DIAGNOSIS — Z01419 Encounter for gynecological examination (general) (routine) without abnormal findings: Secondary | ICD-10-CM

## 2012-02-04 MED ORDER — MEDROXYPROGESTERONE ACETATE 150 MG/ML IM SUSP: INTRAMUSCULAR | Status: DC

## 2012-02-04 NOTE — Patient Instructions (Addendum)

## 2012-02-04 NOTE — Assessment & Plan Note (Signed)
Cholecystectomy 12/2011

## 2012-02-04 NOTE — Progress Notes (Signed)
Tabitha Mcconnell 03/20/1975 [id number]    History:    The patient presents for annual exam.  Amenorrheic on Depo-Provera. History of normal Paps. Cholecystectomy Dr. Luisa Hart - 12/2011, returning to work next week, doing well.   Past medical history, past surgical history, family history and social history were all reviewed and documented in the EPIC chart. Works at [employer]. Quit smoking October 2012. Normal bone density 2009. Tabitha Mcconnell 11 doing well.   ROS:  A  ROS was performed and pertinent positives and negatives are included in the history.  Exam:  Filed Vitals:   02/04/12 0807  BP: 120/82    General appearance:  Normal Head/Neck:  Normal, without cervical or supraclavicular adenopathy. Thyroid:  Symmetrical, normal in size, without palpable masses or nodularity. Respiratory  Effort:  Normal  Auscultation:  Clear without wheezing or rhonchi Cardiovascular  Auscultation:  Regular rate, without rubs, murmurs or gallops  Edema/varicosities:  Not grossly evident Abdominal  Soft,nontender, without masses, guarding or rebound.  Liver/spleen:  No organomegaly noted  Hernia:  None appreciated  Skin  Inspection:  Grossly normal  Palpation:  Grossly normal Neurologic/psychiatric  Orientation:  Normal with appropriate conversation.  Mood/affect:  Normal  Genitourinary    Breasts: Examined lying and sitting.     Right: Without masses, retractions, discharge or axillary adenopathy.     Left: Without masses, retractions, discharge or axillary adenopathy.   Inguinal/mons:  Normal without inguinal adenopathy  External genitalia:  Normal  BUS/Urethra/Skene's glands:  Normal  Bladder:  Normal  Vagina:  Normal  Cervix:  Normal  Uterus:  normal in size, shape and contour.  Midline and mobile  Adnexa/parametria:     Rt: Without masses or tenderness.   Lt: Without masses or tenderness.  Anus and perineum: Normal  Digital rectal exam: Normal sphincter tone without palpated  masses or tenderness  Assessment/Plan:  37 y.o. SBF G1 P1 for annual exam without complaint.  Normal GYN exam/amenorrheic on Depo-Provera since 2001 Status post cholecystectomy 12/2011 doing well Obesity  Plan: Depo-Provera 150 IM every 12 weeks, prescription, proper use given and reviewed, neighbor who is a Technical brewer. Planning to start Weight [employer]. Reviewed importance of calcium rich diet, vitamin D 1000 daily encouraged, increase regular exercise and decrease calories for weight loss. SBE's encouraged. No labs, normal CBC 12/2011, normal Pap 01/2011, new screening guidelines reviewed. Condoms encouraged if become sexually active.     Harrington Challenger [facility], 8:45 AM 02/04/2012

## 2012-02-15 ENCOUNTER — Encounter: Payer: Self-pay | Admitting: Women's Health

## 2012-03-20 ENCOUNTER — Other Ambulatory Visit: Payer: Self-pay | Admitting: Women's Health

## 2013-02-06 ENCOUNTER — Encounter: Payer: Managed Care, Other (non HMO) | Admitting: Women's Health

## 2013-02-16 ENCOUNTER — Encounter: Payer: Self-pay | Admitting: Women's Health

## 2013-03-02 ENCOUNTER — Ambulatory Visit (INDEPENDENT_AMBULATORY_CARE_PROVIDER_SITE_OTHER): Payer: Managed Care, Other (non HMO) | Admitting: Women's Health

## 2013-03-02 ENCOUNTER — Encounter: Payer: Self-pay | Admitting: Women's Health

## 2013-03-02 VITALS — BP 112/67 | Ht 61.0 in | Wt 214.2 lb

## 2013-03-02 DIAGNOSIS — E079 Disorder of thyroid, unspecified: Secondary | ICD-10-CM

## 2013-03-02 DIAGNOSIS — Z01419 Encounter for gynecological examination (general) (routine) without abnormal findings: Secondary | ICD-10-CM

## 2013-03-02 DIAGNOSIS — Z13220 Encounter for screening for lipoid disorders: Secondary | ICD-10-CM

## 2013-03-02 DIAGNOSIS — Z833 Family history of diabetes mellitus: Secondary | ICD-10-CM

## 2013-03-02 DIAGNOSIS — Z113 Encounter for screening for infections with a predominantly sexual mode of transmission: Secondary | ICD-10-CM

## 2013-03-02 LAB — CBC WITH DIFFERENTIAL/PLATELET
BASOS ABS: 0 10*3/uL (ref 0.0–0.1)
BASOS PCT: 0 % (ref 0–1)
EOS ABS: 0.1 10*3/uL (ref 0.0–0.7)
Eosinophils Relative: 1 % (ref 0–5)
HCT: 40.3 % (ref 36.0–46.0)
Hemoglobin: 14.1 g/dL (ref 12.0–15.0)
Lymphocytes Relative: 40 % (ref 12–46)
Lymphs Abs: 2.9 10*3/uL (ref 0.7–4.0)
MCH: 29.9 pg (ref 26.0–34.0)
MCHC: 35 g/dL (ref 30.0–36.0)
MCV: 85.6 fL (ref 78.0–100.0)
MONOS PCT: 8 % (ref 3–12)
Monocytes Absolute: 0.6 10*3/uL (ref 0.1–1.0)
NEUTROS ABS: 3.7 10*3/uL (ref 1.7–7.7)
NEUTROS PCT: 51 % (ref 43–77)
PLATELETS: 292 10*3/uL (ref 150–400)
RBC: 4.71 MIL/uL (ref 3.87–5.11)
RDW: 13.7 % (ref 11.5–15.5)
WBC: 7.2 10*3/uL (ref 4.0–10.5)

## 2013-03-02 LAB — LIPID PANEL
CHOL/HDL RATIO: 3.9 ratio
CHOLESTEROL: 161 mg/dL (ref 0–200)
HDL: 41 mg/dL (ref >39)
LDL Cholesterol: 103 mg/dL — ABNORMAL HIGH (ref 0–99)
Triglycerides: 83 mg/dL (ref <150)
VLDL: 17 mg/dL (ref 0–40)

## 2013-03-02 LAB — GLUCOSE, RANDOM: GLUCOSE: 74 mg/dL (ref 70–99)

## 2013-03-02 LAB — TSH: TSH: 2.219 u[IU]/mL (ref 0.350–4.500)

## 2013-03-02 MED ORDER — MEDROXYPROGESTERONE ACETATE 150 MG/ML IM SUSP: INTRAMUSCULAR | Status: DC

## 2013-03-02 NOTE — Patient Instructions (Signed)

## 2013-03-02 NOTE — Addendum Note (Signed)
Addended by: Alen Blew on: 03/02/2013 04:19 PM   Modules accepted: Orders

## 2013-03-02 NOTE — Progress Notes (Signed)
Tabitha Mcconnell [birth year] [id number]    History:    Presents for annual exam.  Amenorrheic on Depo-Provera/new partner. Normal Pap history. Normal DEXA 2009.   Past medical history, past surgical history, family history and social history were all reviewed and documented in the EPIC chart. Cholecystectomy 12/2011.Quit smoking 2012. Works at [employer] [place]. [name] 12 doing well.  ROS:  A  ROS was performed and pertinent positives and negatives are included.  Exam:  Filed Vitals:   03/02/13 1426  BP: 112/67    General appearance:  Normal Thyroid:  Symmetrical, normal in size, without palpable masses or nodularity. Respiratory  Auscultation:  Clear without wheezing or rhonchi Cardiovascular  Auscultation:  Regular rate, without rubs, murmurs or gallops  Edema/varicosities:  Not grossly evident Abdominal  Soft,nontender, without masses, guarding or rebound.  Liver/spleen:  No organomegaly noted  Hernia:  None appreciated  Skin  Inspection:  Grossly normal   Breasts: Examined lying and sitting.     Right: Without masses, retractions, discharge or axillary adenopathy.     Left: Without masses, retractions, discharge or axillary adenopathy. Gentitourinary   Inguinal/mons:  Normal without inguinal adenopathy  External genitalia:  Normal  BUS/Urethra/Skene's glands:  Normal  Vagina:  Normal  Cervix:  Normal  Uterus:   normal in size, shape and contour.  Midline and mobile  Adnexa/parametria:     Rt: Without masses or tenderness.   Lt: Without masses or tenderness.  Anus and perineum: Normal  Digital rectal exam: Normal sphincter tone without palpated masses or tenderness  Assessment/Plan:  38 y.o. SBF G1P1 for annual exam with no complaints.  STD screening Obesity  Plan: Depo-Provera 150 prescription given, return to office every 12 weeks for injection. Reviewed importance of calcium rich diet, vitamin D 2000 daily. Annual screening at 5, baseline now reviewed. SBE's,  increase regular exercise and decrease calories for weight loss. CBC, glucose, TSH, lipid panel UA, Pap, Pap normal 12/2009, new screening guidelines reviewed. GC/Chlamydia, HIV, hep B, C., RPRHuel Cote WHNP, 3:09 PM 03/02/2013

## 2013-03-03 ENCOUNTER — Other Ambulatory Visit (HOSPITAL_COMMUNITY)
Admission: RE | Admit: 2013-03-03 | Discharge: 2013-03-03 | Disposition: A | Discharge status: Home / Self Care | Payer: Managed Care, Other (non HMO) | Source: Ambulatory Visit | Attending: Gynecology | Admitting: Gynecology

## 2013-03-03 LAB — RPR

## 2013-03-03 LAB — URINALYSIS W MICROSCOPIC + REFLEX CULTURE
Bilirubin Urine: NEGATIVE
CRYSTALS: NONE SEEN
Casts: NONE SEEN
GLUCOSE, UA: NEGATIVE mg/dL
Hgb urine dipstick: NEGATIVE
Ketones, ur: NEGATIVE mg/dL
Nitrite: NEGATIVE
PROTEIN: NEGATIVE mg/dL
SPECIFIC GRAVITY, URINE: 1.027 (ref 1.005–1.030)
Urobilinogen, UA: 1 mg/dL (ref 0.0–1.0)
pH: 6 (ref 5.0–8.0)

## 2013-03-03 LAB — GC/CHLAMYDIA PROBE AMP
CT PROBE, AMP APTIMA: NEGATIVE
GC PROBE AMP APTIMA: NEGATIVE

## 2013-03-03 LAB — HEPATITIS B SURFACE ANTIGEN: Hepatitis B Surface Ag: NEGATIVE

## 2013-03-03 LAB — URINE CULTURE: Colony Count: 30000

## 2013-03-03 LAB — HEPATITIS C ANTIBODY: HCV AB: NEGATIVE

## 2013-03-03 LAB — HIV ANTIBODY (ROUTINE TESTING W REFLEX): HIV: NONREACTIVE

## 2013-09-04 IMAGING — US US ABDOMEN COMPLETE
1 series · 14 of 25 positions shown · non-contrast
Comparison: None.

CLINICAL DATA: Right upper quadrant pain.

COMPLETE ABDOMINAL ULTRASOUND

[Series 1: us abdomen complete · 0.30mm/px · 14 of 48 slices shown]
[im 1/48]
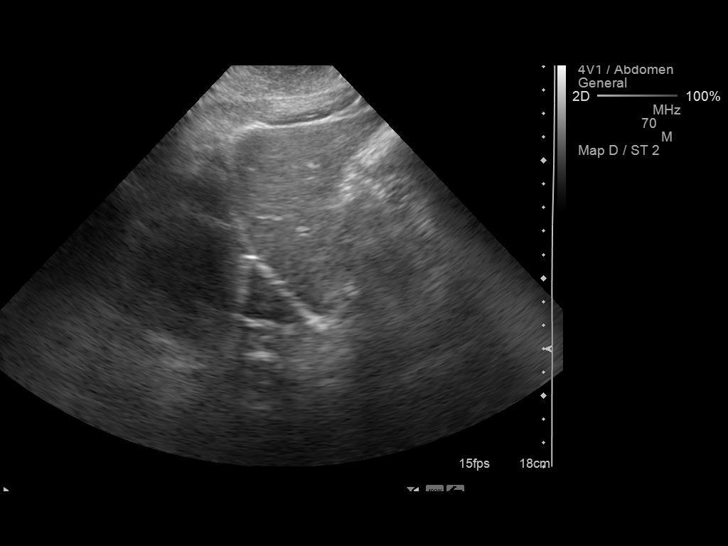
[im 4/48]
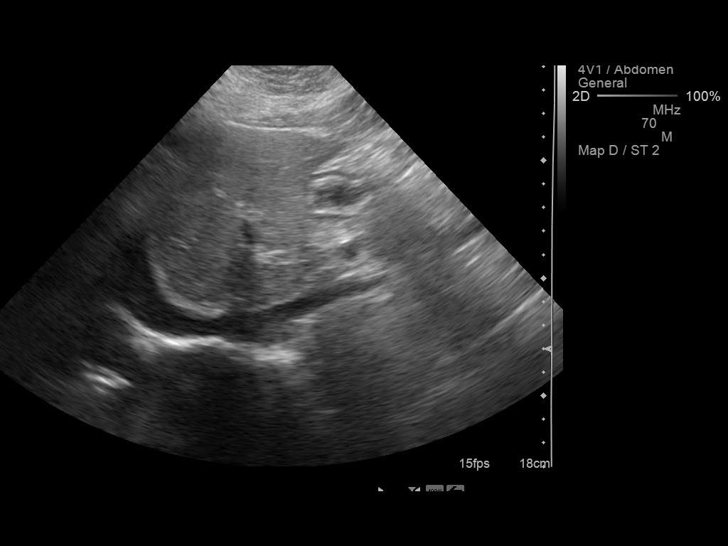
[im 8/48]
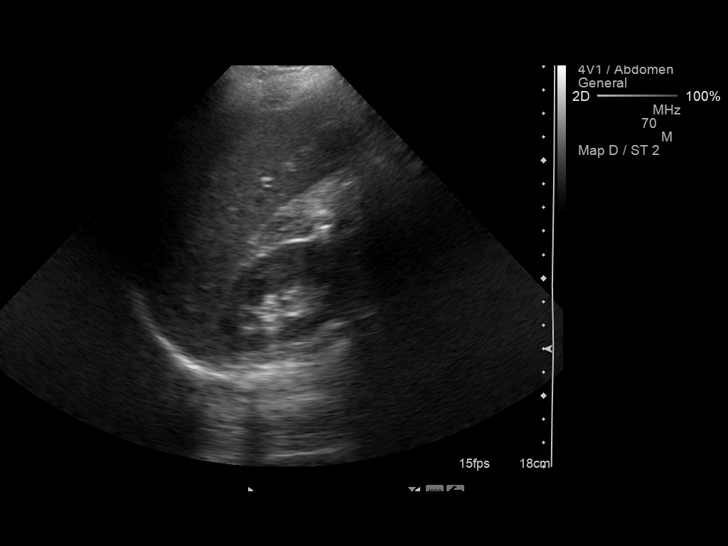
[im 12/48]
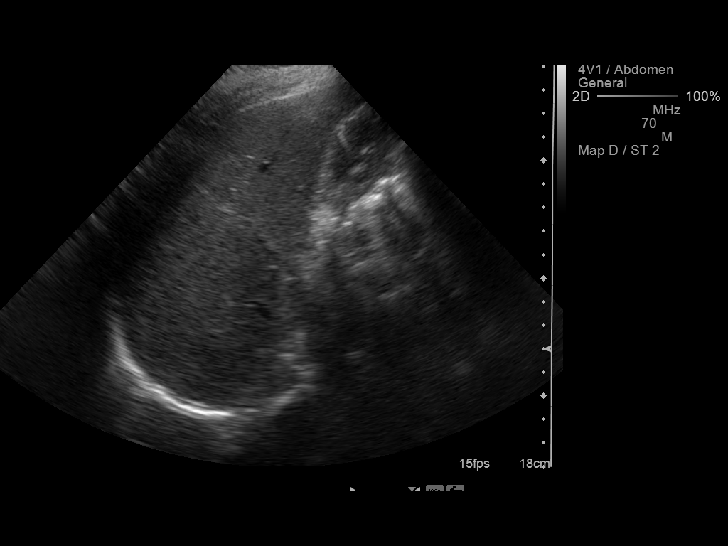
[im 16/48]
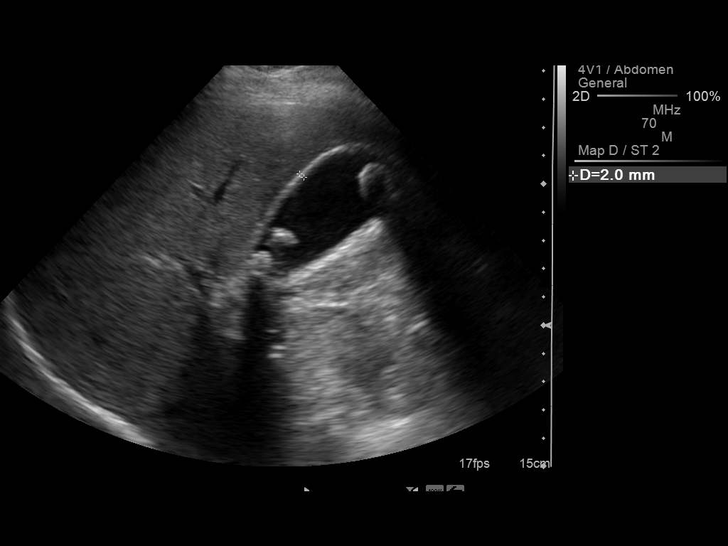
[im 18/48]
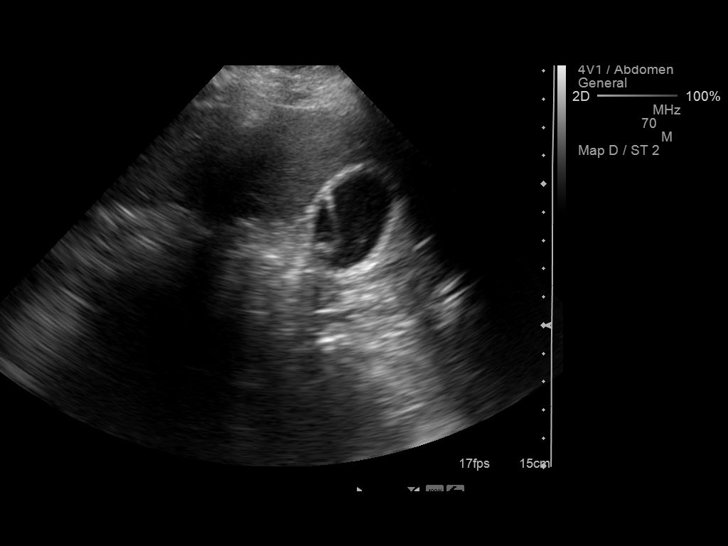
[im 22/48]
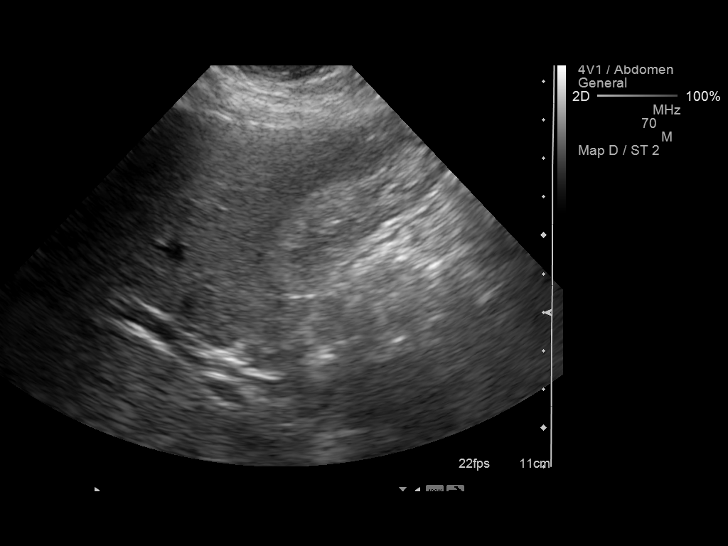
[im 26/48]
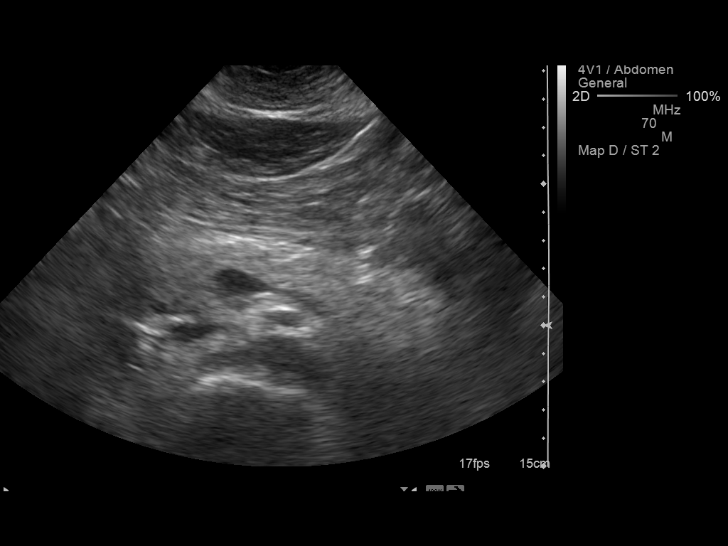
[im 30/48]
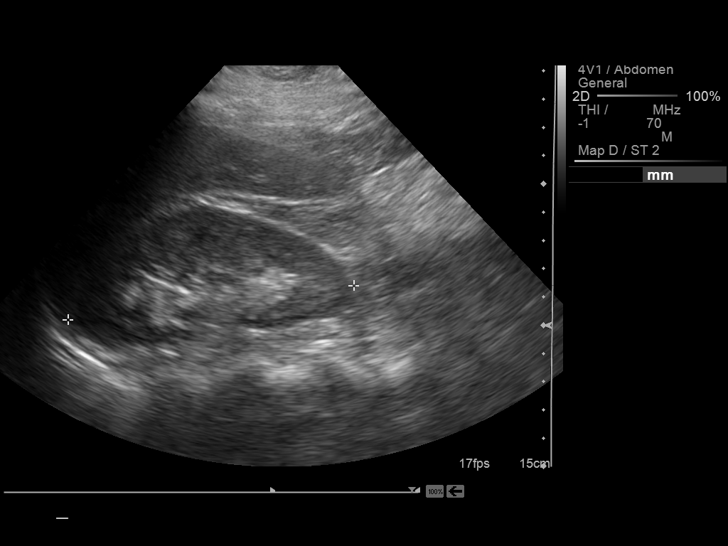
[im 32/48]
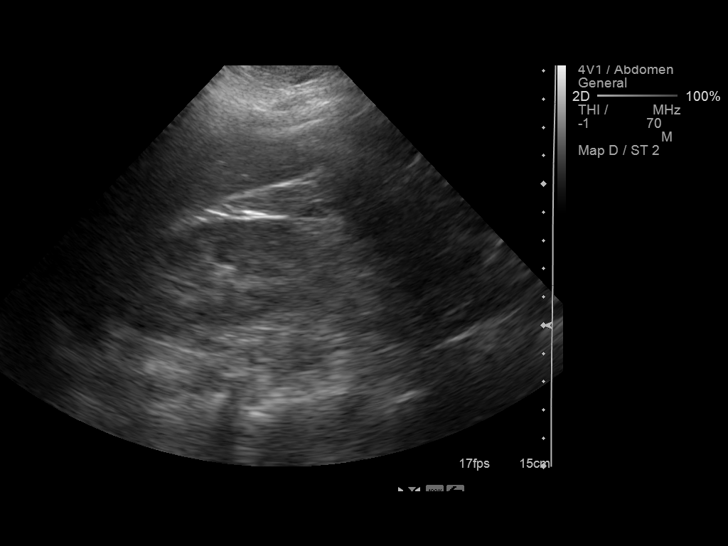
[im 36/48]
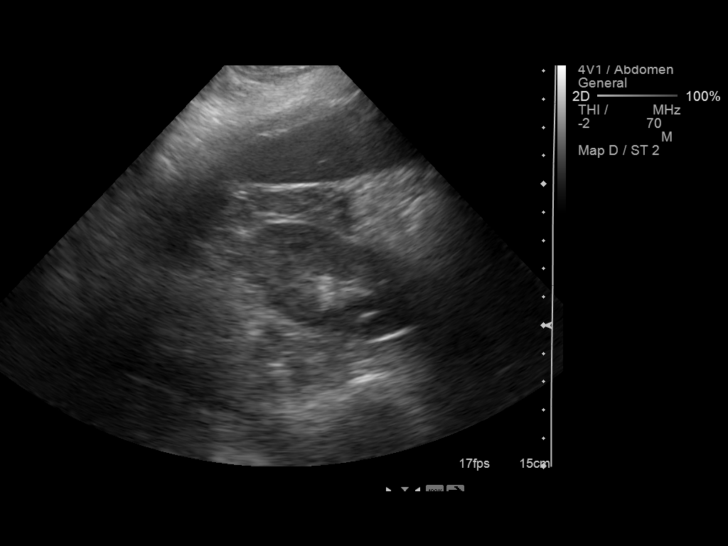
[im 40/48]
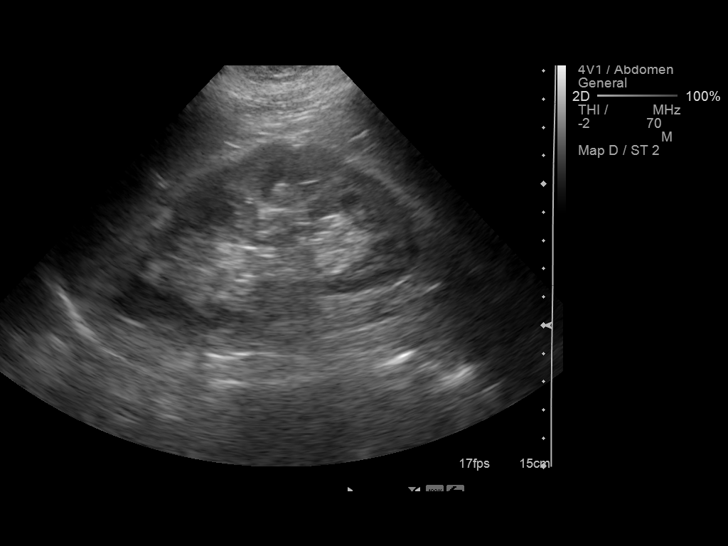
[im 44/48]
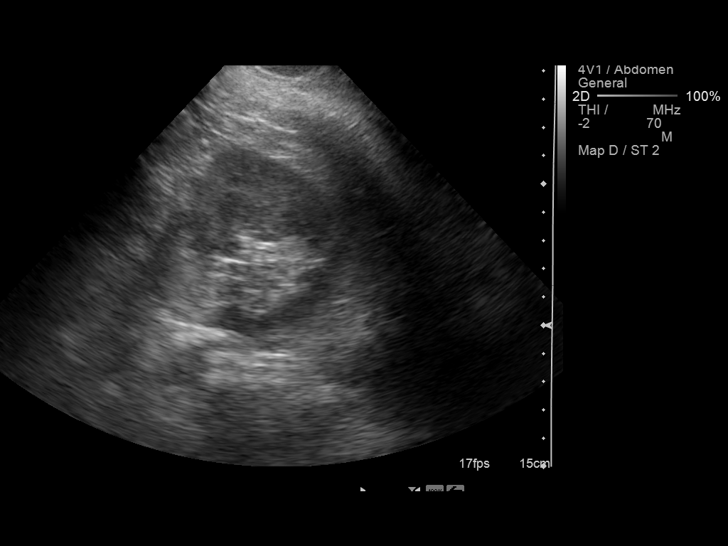
[im 48/48]
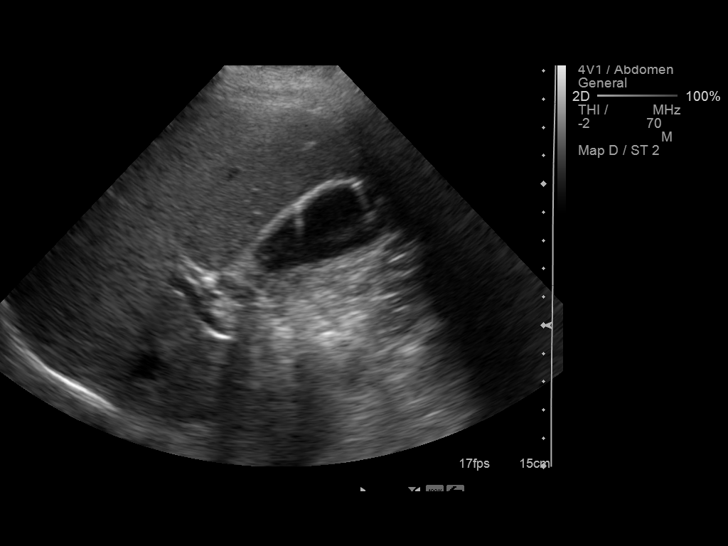

[14 of 25 positions shown; findings below may reference images not displayed]

FINDINGS: Gallbladder:  Multiple stones in the gallbladder measuring up to
1.4 cm.  Sludge in the gallbladder.  No significant gallbladder
wall thickening.  Ring down artifact consistent with
adenomyomatosis.Murphy's sign is negative.

Common bile duct:  Normal caliber with measured diameter of 3.3 mm.

Liver:  No focal lesion identified.  Within normal limits in
parenchymal echogenicity.

IVC:  Appears normal.

Pancreas:  Visualized head and body of the pancreas are
unremarkable.

Spleen:  Spleen length measures 9.7 cm.  Normal parenchymal
echotexture.

Right Kidney:  Right kidney measures 10.2 cm length.  No
hydronephrosis.

Left Kidney:  Left kidney measures 11 cm length.  No
hydronephrosis.

Abdominal aorta:  No aneurysm identified.
IMPRESSION: Cholelithiasis with gallbladder sludge.  Gallbladder
adenomyomatosis.

## 2013-10-30 IMAGING — CR DG CHEST 2V
2 series · 2 of 2 positions shown · non-contrast
Comparison: None.

CLINICAL DATA: Preoperative cholecystectomy

CHEST - 2 VIEW

[view not recorded (1 of 2)]
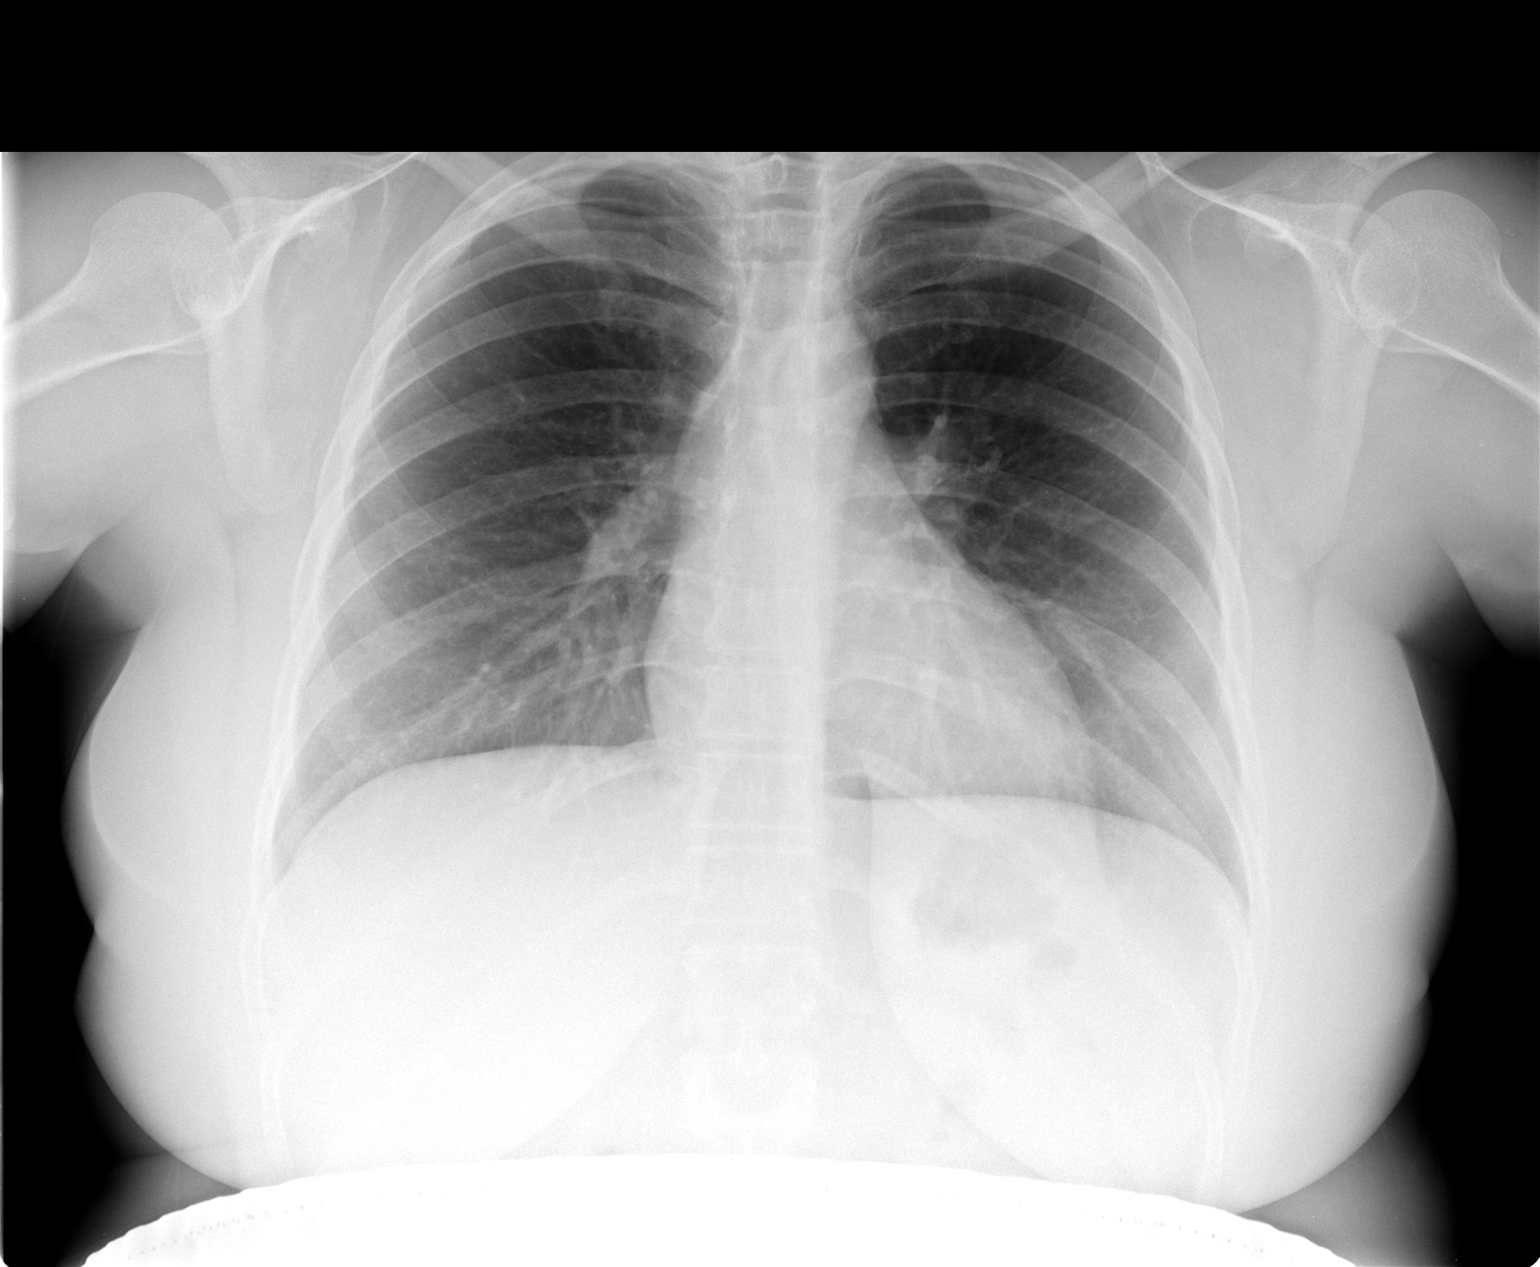

[view not recorded (2 of 2)]
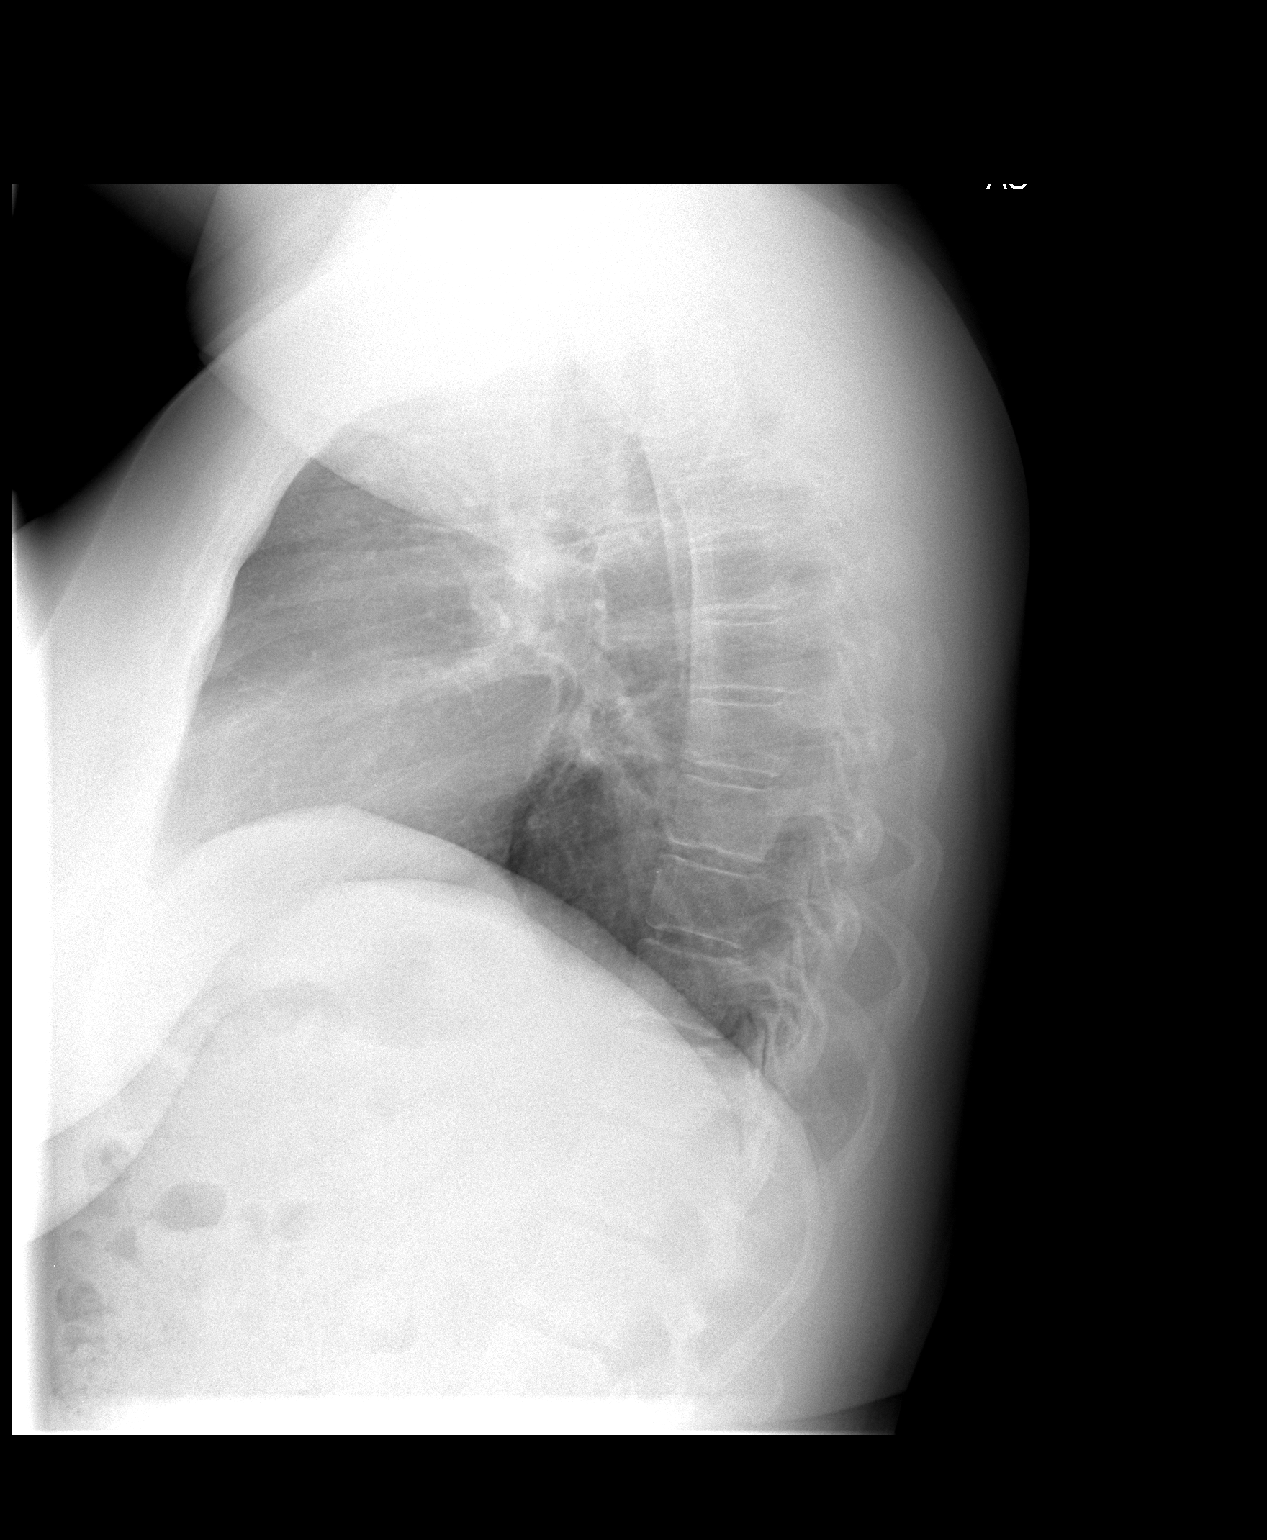

[2 of 2 positions shown; findings below may reference images not displayed]

FINDINGS: Lungs clear.  Heart size and pulmonary vascularity are
normal.  No adenopathy.  No bone lesions.
IMPRESSION: No edema or consolidation.

## 2013-11-27 ENCOUNTER — Encounter: Payer: Self-pay | Admitting: Women's Health

## 2014-03-14 ENCOUNTER — Ambulatory Visit (INDEPENDENT_AMBULATORY_CARE_PROVIDER_SITE_OTHER): Payer: Managed Care, Other (non HMO) | Admitting: Women's Health

## 2014-03-14 ENCOUNTER — Encounter: Payer: Self-pay | Admitting: Women's Health

## 2014-03-14 VITALS — BP 120/84 | Ht 61.0 in | Wt 216.0 lb

## 2014-03-14 DIAGNOSIS — Z01419 Encounter for gynecological examination (general) (routine) without abnormal findings: Secondary | ICD-10-CM

## 2014-03-14 DIAGNOSIS — Z3042 Encounter for surveillance of injectable contraceptive: Secondary | ICD-10-CM

## 2014-03-14 MED ORDER — MEDROXYPROGESTERONE ACETATE 150 MG/ML IM SUSP: INTRAMUSCULAR | Status: DC

## 2014-03-14 NOTE — Patient Instructions (Signed)
Health Maintenance Adopting a healthy lifestyle and getting preventive care can go a long way to promote health and wellness. Talk with your health care provider about what schedule of regular examinations is right for you. This is a good chance for you to check in with your provider about disease prevention and staying healthy. In between checkups, there are plenty of things you can do on your own. Experts have done a lot of research about which lifestyle changes and preventive measures are most likely to keep you healthy. Ask your health care provider for more information. WEIGHT AND DIET  Eat a healthy diet  Be sure to include plenty of vegetables, fruits, low-fat dairy products, and lean protein.  Do not eat a lot of foods high in solid fats, added sugars, or salt.  Get regular exercise. This is one of the most important things you can do for your health.  Most adults should exercise for at least 150 minutes each week. The exercise should increase your heart rate and make you sweat (moderate-intensity exercise).  Most adults should also do strengthening exercises at least twice a week. This is in addition to the moderate-intensity exercise.  Maintain a healthy weight  Body mass index (BMI) is a measurement that can be used to identify possible weight problems. It estimates body fat based on height and weight. Your health care provider can help determine your BMI and help you achieve or maintain a healthy weight.  For females 25 years of age and older:   A BMI below 18.5 is considered underweight.  A BMI of 18.5 to 24.9 is normal.  A BMI of 25 to 29.9 is considered overweight.  A BMI of 30 and above is considered obese.  Watch levels of cholesterol and blood lipids  You should start having your blood tested for lipids and cholesterol at 40 years of age, then have this test every 5 years.  You may need to have your cholesterol levels checked more often if:  Your lipid or  cholesterol levels are high.  You are older than 40 years of age.  You are at high risk for heart disease.  CANCER SCREENING   Lung Cancer  Lung cancer screening is recommended for adults 97-92 years old who are at high risk for lung cancer because of a history of smoking.  A yearly low-dose CT scan of the lungs is recommended for people who:  Currently smoke.  Have quit within the past 15 years.  Have at least a 30-pack-year history of smoking. A pack year is smoking an average of one pack of cigarettes a day for 1 year.  Yearly screening should continue until it has been 15 years since you quit.  Yearly screening should stop if you develop a health problem that would prevent you from having lung cancer treatment.  Breast Cancer  Practice breast self-awareness. This means understanding how your breasts normally appear and feel.  It also means doing regular breast self-exams. Let your health care provider know about any changes, no matter how small.  If you are in your 20s or 30s, you should have a clinical breast exam (CBE) by a health care provider every 1-3 years as part of a regular health exam.  If you are 76 or older, have a CBE every year. Also consider having a breast X-ray (mammogram) every year.  If you have a family history of breast cancer, talk to your health care provider about genetic screening.  If you are  at high risk for breast cancer, talk to your health care provider about having an MRI and a mammogram every year.  Breast cancer gene (BRCA) assessment is recommended for women who have family members with BRCA-related cancers. BRCA-related cancers include:  Breast.  Ovarian.  Tubal.  Peritoneal cancers.  Results of the assessment will determine the need for genetic counseling and BRCA1 and BRCA2 testing. Cervical Cancer Routine pelvic examinations to screen for cervical cancer are no longer recommended for nonpregnant women who are considered low  risk for cancer of the pelvic organs (ovaries, uterus, and vagina) and who do not have symptoms. A pelvic examination may be necessary if you have symptoms including those associated with pelvic infections. Ask your health care provider if a screening pelvic exam is right for you.   The Pap test is the screening test for cervical cancer for women who are considered at risk.  If you had a hysterectomy for a problem that was not cancer or a condition that could lead to cancer, then you no longer need Pap tests.  If you are older than 65 years, and you have had normal Pap tests for the past 10 years, you no longer need to have Pap tests.  If you have had past treatment for cervical cancer or a condition that could lead to cancer, you need Pap tests and screening for cancer for at least 20 years after your treatment.  If you no longer get a Pap test, assess your risk factors if they change (such as having a new sexual partner). This can affect whether you should start being screened again.  Some women have medical problems that increase their chance of getting cervical cancer. If this is the case for you, your health care provider may recommend more frequent screening and Pap tests.  The human papillomavirus (HPV) test is another test that may be used for cervical cancer screening. The HPV test looks for the virus that can cause cell changes in the cervix. The cells collected during the Pap test can be tested for HPV.  The HPV test can be used to screen women 30 years of age and older. Getting tested for HPV can extend the interval between normal Pap tests from three to five years.  An HPV test also should be used to screen women of any age who have unclear Pap test results.  After 40 years of age, women should have HPV testing as often as Pap tests.  Colorectal Cancer  This type of cancer can be detected and often prevented.  Routine colorectal cancer screening usually begins at 40 years of  age and continues through 40 years of age.  Your health care provider may recommend screening at an earlier age if you have risk factors for colon cancer.  Your health care provider may also recommend using home test kits to check for hidden blood in the stool.  A small camera at the end of a tube can be used to examine your colon directly (sigmoidoscopy or colonoscopy). This is done to check for the earliest forms of colorectal cancer.  Routine screening usually begins at age 50.  Direct examination of the colon should be repeated every 5-10 years through 40 years of age. However, you may need to be screened more often if early forms of precancerous polyps or small growths are found. Skin Cancer  Check your skin from head to toe regularly.  Tell your health care provider about any new moles or changes in   moles, especially if there is a change in a mole's shape or color.  Also tell your health care provider if you have a mole that is larger than the size of a pencil eraser.  Always use sunscreen. Apply sunscreen liberally and repeatedly throughout the day.  Protect yourself by wearing long sleeves, pants, a wide-brimmed hat, and sunglasses whenever you are outside. HEART DISEASE, DIABETES, AND HIGH BLOOD PRESSURE   Have your blood pressure checked at least every 1-2 years. High blood pressure causes heart disease and increases the risk of stroke.  If you are between 75 years and 42 years old, ask your health care provider if you should take aspirin to prevent strokes.  Have regular diabetes screenings. This involves taking a blood sample to check your fasting blood sugar level.  If you are at a normal weight and have a low risk for diabetes, have this test once every three years after 40 years of age.  If you are overweight and have a high risk for diabetes, consider being tested at a younger age or more often. PREVENTING INFECTION  Hepatitis B  If you have a higher risk for  hepatitis B, you should be screened for this virus. You are considered at high risk for hepatitis B if:  You were born in a country where hepatitis B is common. Ask your health care provider which countries are considered high risk.  Your parents were born in a high-risk country, and you have not been immunized against hepatitis B (hepatitis B vaccine).  You have HIV or AIDS.  You use needles to inject street drugs.  You live with someone who has hepatitis B.  You have had sex with someone who has hepatitis B.  You get hemodialysis treatment.  You take certain medicines for conditions, including cancer, organ transplantation, and autoimmune conditions. Hepatitis C  Blood testing is recommended for:  Everyone born from [birth year] through 1966.  Anyone with known risk factors for hepatitis C. Sexually transmitted infections (STIs)  You should be screened for sexually transmitted infections (STIs) including gonorrhea and chlamydia if:  You are sexually active and are younger than 40 years of age.  You are older than 40 years of age and your health care provider tells you that you are at risk for this type of infection.  Your sexual activity has changed since you were last screened and you are at an increased risk for chlamydia or gonorrhea. Ask your health care provider if you are at risk.  If you do not have HIV, but are at risk, it may be recommended that you take a prescription medicine daily to prevent HIV infection. This is called pre-exposure prophylaxis (PrEP). You are considered at risk if:  You are sexually active and do not regularly use condoms or know the HIV status of your partner(s).  You take drugs by injection.  You are sexually active with a partner who has HIV. Talk with your health care provider about whether you are at high risk of being infected with HIV. If you choose to begin PrEP, you should first be tested for HIV. You should then be tested every 3 months for  as long as you are taking PrEP.  PREGNANCY   If you are premenopausal and you may become pregnant, ask your health care provider about preconception counseling.  If you may become pregnant, take 400 to 800 micrograms (mcg) of folic acid every day.  If you want to prevent pregnancy, talk to your  health care provider about birth control (contraception). OSTEOPOROSIS AND MENOPAUSE   Osteoporosis is a disease in which the bones lose minerals and strength with aging. This can result in serious bone fractures. Your risk for osteoporosis can be identified using a bone density scan.  If you are 65 years of age or older, or if you are at risk for osteoporosis and fractures, ask your health care provider if you should be screened.  Ask your health care provider whether you should take a calcium or vitamin D supplement to lower your risk for osteoporosis.  Menopause may have certain physical symptoms and risks.  Hormone replacement therapy may reduce some of these symptoms and risks. Talk to your health care provider about whether hormone replacement therapy is right for you.  HOME CARE INSTRUCTIONS   Schedule regular health, dental, and eye exams.  Stay current with your immunizations.   Do not use any tobacco products including cigarettes, chewing tobacco, or electronic cigarettes.  If you are pregnant, do not drink alcohol.  If you are breastfeeding, limit how much and how often you drink alcohol.  Limit alcohol intake to no more than 1 drink per day for nonpregnant women. One drink equals 12 ounces of beer, 5 ounces of wine, or 1 ounces of hard liquor.  Do not use street drugs.  Do not share needles.  Ask your health care provider for help if you need support or information about quitting drugs.  Tell your health care provider if you often feel depressed.  Tell your health care provider if you have ever been abused or do not feel safe at home. Document Released: 07/28/2010  Document Revised: 05/29/2013 Document Reviewed: 12/14/2012 Exit[employer] Patient Information 2015 [employer]. This information is not intended to replace advice given to you by your health care provider. Make sure you discuss any questions you have with your health care provider. Exercise to Stay Healthy Exercise helps you become and stay healthy. EXERCISE IDEAS AND TIPS Choose exercises that:  You enjoy.  Fit into your day. You do not need to exercise really hard to be healthy. You can do exercises at a slow or medium level and stay healthy. You can:  Stretch before and after working out.  Try yoga, Pilates, or tai chi.  Lift weights.  Walk fast, swim, jog, run, climb stairs, bicycle, dance, or rollerskate.  Take aerobic classes. Exercises that burn about 150 calories:  Running 1  miles in 15 minutes.  Playing volleyball for 45 to 60 minutes.  Washing and waxing a car for 45 to 60 minutes.  Playing touch football for 45 minutes.  Walking 1  miles in 35 minutes.  Pushing a stroller 1  miles in 30 minutes.  Playing basketball for 30 minutes.  Raking leaves for 30 minutes.  Bicycling 5 miles in 30 minutes.  Walking 2 miles in 30 minutes.  Dancing for 30 minutes.  Shoveling snow for 15 minutes.  Swimming laps for 20 minutes.  Walking up stairs for 15 minutes.  Bicycling 4 miles in 15 minutes.  Gardening for 30 to 45 minutes.  Jumping rope for 15 minutes.  Washing windows or floors for 45 to 60 minutes. Document Released: 02/14/2010 Document Revised: 04/06/2011 Document Reviewed: 02/14/2010 Exit[employer] Patient Information 2015 [employer]. This information is not intended to replace advice given to you by your health care provider. Make sure you discuss any questions you have with your health care provider.  

## 2014-03-14 NOTE — Progress Notes (Signed)
Tabitha Mcconnell [birth year] [id number]    History:    Presents for annual exam.  Amenorrheic on Depo-Provera. Same partner with negative STD screen. Labs at health screening at work reports as normal. Normal DEXA 2009. Normal Pap history.  Past medical history, past surgical history, family history and social history were all reviewed and documented in the EPIC chart. Works at [employer] [place] . [name] 14 doing well. Father heart disease.  ROS:  A ROS was performed and pertinent positives and negatives are included.  Exam:  Filed Vitals:   03/14/14 1512  BP: 120/84    General appearance:  Normal Thyroid:  Symmetrical, normal in size, without palpable masses or nodularity. Respiratory  Auscultation:  Clear without wheezing or rhonchi Cardiovascular  Auscultation:  Regular rate, without rubs, murmurs or gallops  Edema/varicosities:  Not grossly evident Abdominal  Soft,nontender, without masses, guarding or rebound.  Liver/spleen:  No organomegaly noted  Hernia:  None appreciated  Skin  Inspection:  Grossly normal   Breasts: Examined lying and sitting.     Right: Without masses, retractions, discharge or axillary adenopathy.     Left: Without masses, retractions, discharge or axillary adenopathy. Gentitourinary   Inguinal/mons:  Normal without inguinal adenopathy  External genitalia:  Normal  BUS/Urethra/Skene's glands:  Normal  Vagina:  Normal  Cervix:  Normal  Uterus:  normal in size, shape and contour.  Midline and mobile  Adnexa/parametria:     Rt: Without masses or tenderness.   Lt: Without masses or tenderness.  Anus and perineum: Normal  Digital rectal exam: Normal sphincter tone without palpated masses or tenderness  Assessment/Plan:  39 y.o. SBF G1 P1 for annual exam with no complaints.  Amenorrheic on Depo-Provera Obesity  Plan: Depo-Provera 150 every 12 weeks prescription, proper use given and reviewed.(Neighbor who is a Firefighter.) Condoms encouraged  until permanent partner. SBE's, annual screening mammogram at 40. Increase regular exercise and decrease calories for weight loss, vitamin D 1000 daily encouraged. UA, Pap normal 2015, new screening guidelines reviewed.  [place], 5:15 PM 03/14/2014

## 2014-03-15 LAB — URINALYSIS W MICROSCOPIC + REFLEX CULTURE
BILIRUBIN URINE: NEGATIVE
Casts: NONE SEEN
Crystals: NONE SEEN
GLUCOSE, UA: NEGATIVE mg/dL
HGB URINE DIPSTICK: NEGATIVE
Nitrite: NEGATIVE
PROTEIN: NEGATIVE mg/dL
Specific Gravity, Urine: 1.028 (ref 1.005–1.030)
UROBILINOGEN UA: 0.2 mg/dL (ref 0.0–1.0)
pH: 5.5 (ref 5.0–8.0)

## 2014-03-16 LAB — URINE CULTURE
COLONY COUNT: NO GROWTH
ORGANISM ID, BACTERIA: NO GROWTH

## 2015-03-19 ENCOUNTER — Encounter: Payer: Managed Care, Other (non HMO) | Admitting: Women's Health

## 2015-04-04 ENCOUNTER — Ambulatory Visit (INDEPENDENT_AMBULATORY_CARE_PROVIDER_SITE_OTHER): Payer: Managed Care, Other (non HMO) | Admitting: Women's Health

## 2015-04-04 ENCOUNTER — Encounter: Payer: Self-pay | Admitting: Women's Health

## 2015-04-04 VITALS — BP 132/90 | Ht 61.0 in | Wt 214.0 lb

## 2015-04-04 DIAGNOSIS — Z3042 Encounter for surveillance of injectable contraceptive: Secondary | ICD-10-CM

## 2015-04-04 DIAGNOSIS — N841 Polyp of cervix uteri: Secondary | ICD-10-CM | POA: Diagnosis not present

## 2015-04-04 DIAGNOSIS — Z13220 Encounter for screening for lipoid disorders: Secondary | ICD-10-CM | POA: Diagnosis not present

## 2015-04-04 MED ORDER — MEDROXYPROGESTERONE ACETATE 150 MG/ML IM SUSP: INTRAMUSCULAR | Status: DC

## 2015-04-04 NOTE — Progress Notes (Signed)
LIAN POUNDS 07/07/1975 [id number]    History:    Presents for annual exam.  Amenorrheic on Depo-Provera 150 q12 weeks. 2009 normal DEXA scan. Sexually active, same partner for 3 years, negative STD screen 2015. Normal pap history. Gets blood work completed at work, reports as normal.  Past medical history, past surgical history, family history and social history were all reviewed and documented in the EPIC chart. Works at [employer] [place]. Jayden (15) doing well. Father with heart disease.  ROS:  A ROS was performed and pertinent positives and negatives are included.   Exam:  Filed Vitals:   04/04/15 1601  BP: 132/90    General appearance:  Normal Thyroid:  Symmetrical, normal in size, without palpable masses or nodularity. Respiratory  Auscultation:  Clear without wheezing or rhonchi Cardiovascular  Auscultation:  Regular rate, without rubs, murmurs or gallops  Edema/varicosities:  Not grossly evident Abdominal  Soft,nontender, without masses, guarding or rebound.  Liver/spleen:  No organomegaly noted  Hernia:  None appreciated  Skin  Inspection:  Grossly normal   Breasts: Examined lying and sitting.     Right: Without masses, retractions, discharge or axillary adenopathy.     Left: Without masses, retractions, discharge or axillary adenopathy. Gentitourinary   Inguinal/mons:  Normal without inguinal adenopathy  External genitalia:  Normal  BUS/Urethra/Skene's glands:  Normal  Vagina:  Normal  Cervix:  Normal, small 1/2cm endometrial polyp   Uterus: Normal in size, shape and contour.  Midline and mobile  Adnexa/parametria:     Rt: Without masses or tenderness.   Lt: Without masses or tenderness.  Anus and perineum: Normal  Digital rectal exam: Normal sphincter tone without palpated masses or tenderness  Assessment/Plan:  40 y.o. SBF G1P1 presents for annual exam without complaints  Amenorrheic/ Depo-Provera Obesity 1/2cm endometrial polyp/ sent for  pathology Labs-health screening at work  Plan: Removed small endometrial polyp with ring forceps and sent to pathology. SBE's, start annual screening mammogram, 3-D encouraged, increasing exercise and decreasing calories for weight loss, Vitamin D 1000 daily encouraged.  Discussed contraceptive options and Mirena IUD to be placed, risks of infection, perforation or hemorrhage discussed. Will check coverage, Dr. Phineas Real to place, Rx for Depo-Provera given. UA, Pap normal 2015, new screening guidelines reviewed.  [place], 4:36 PM 04/04/2015

## 2015-04-04 NOTE — Patient Instructions (Addendum)
Health Maintenance, Female Adopting a healthy lifestyle and getting preventive care can go a long way to promote health and wellness. Talk with your health care provider about what schedule of regular examinations is right for you. This is a good chance for you to check in with your provider about disease prevention and staying healthy. In between checkups, there are plenty of things you can do on your own. Experts have done a lot of research about which lifestyle changes and preventive measures are most likely to keep you healthy. Ask your health care provider for more information. WEIGHT AND DIET  Eat a healthy diet  Be sure to include plenty of vegetables, fruits, low-fat dairy products, and lean protein.  Do not eat a lot of foods high in solid fats, added sugars, or salt.  Get regular exercise. This is one of the most important things you can do for your health.  Most adults should exercise for at least 150 minutes each week. The exercise should increase your heart rate and make you sweat (moderate-intensity exercise).  Most adults should also do strengthening exercises at least twice a week. This is in addition to the moderate-intensity exercise.  Maintain a healthy weight  Body mass index (BMI) is a measurement that can be used to identify possible weight problems. It estimates body fat based on height and weight. Your health care provider can help determine your BMI and help you achieve or maintain a healthy weight.  For females 20 years of age and older:   A BMI below 18.5 is considered underweight.  A BMI of 18.5 to 24.9 is normal.  A BMI of 25 to 29.9 is considered overweight.  A BMI of 30 and above is considered obese.  Watch levels of cholesterol and blood lipids  You should start having your blood tested for lipids and cholesterol at 41 years of age, then have this test every 5 years.  You may need to have your cholesterol levels checked more often if:  Your lipid  or cholesterol levels are high.  You are older than 41 years of age.  You are at high risk for heart disease.  CANCER SCREENING   Lung Cancer  Lung cancer screening is recommended for adults 55-80 years old who are at high risk for lung cancer because of a history of smoking.  A yearly low-dose CT scan of the lungs is recommended for people who:  Currently smoke.  Have quit within the past 15 years.  Have at least a 30-pack-year history of smoking. A pack year is smoking an average of one pack of cigarettes a day for 1 year.  Yearly screening should continue until it has been 15 years since you quit.  Yearly screening should stop if you develop a health problem that would prevent you from having lung cancer treatment.  Breast Cancer  Practice breast self-awareness. This means understanding how your breasts normally appear and feel.  It also means doing regular breast self-exams. Let your health care provider know about any changes, no matter how small.  If you are in your 20s or 30s, you should have a clinical breast exam (CBE) by a health care provider every 1-3 years as part of a regular health exam.  If you are 40 or older, have a CBE every year. Also consider having a breast X-ray (mammogram) every year.  If you have a family history of breast cancer, talk to your health care provider about genetic screening.  If you   are at high risk for breast cancer, talk to your health care provider about having an MRI and a mammogram every year.  Breast cancer gene (BRCA) assessment is recommended for women who have family members with BRCA-related cancers. BRCA-related cancers include:  Breast.  Ovarian.  Tubal.  Peritoneal cancers.  Results of the assessment will determine the need for genetic counseling and BRCA1 and BRCA2 testing. Cervical Cancer Your health care provider may recommend that you be screened regularly for cancer of the pelvic organs (ovaries, uterus, and  vagina). This screening involves a pelvic examination, including checking for microscopic changes to the surface of your cervix (Pap test). You may be encouraged to have this screening done every 3 years, beginning at age 21.  For women ages 30-65, health care providers may recommend pelvic exams and Pap testing every 3 years, or they may recommend the Pap and pelvic exam, combined with testing for human papilloma virus (HPV), every 5 years. Some types of HPV increase your risk of cervical cancer. Testing for HPV may also be done on women of any age with unclear Pap test results.  Other health care providers may not recommend any screening for nonpregnant women who are considered low risk for pelvic cancer and who do not have symptoms. Ask your health care provider if a screening pelvic exam is right for you.  If you have had past treatment for cervical cancer or a condition that could lead to cancer, you need Pap tests and screening for cancer for at least 20 years after your treatment. If Pap tests have been discontinued, your risk factors (such as having a new sexual partner) need to be reassessed to determine if screening should resume. Some women have medical problems that increase the chance of getting cervical cancer. In these cases, your health care provider may recommend more frequent screening and Pap tests. Colorectal Cancer  This type of cancer can be detected and often prevented.  Routine colorectal cancer screening usually begins at 41 years of age and continues through 41 years of age.  Your health care provider may recommend screening at an earlier age if you have risk factors for colon cancer.  Your health care provider may also recommend using home test kits to check for hidden blood in the stool.  A small camera at the end of a tube can be used to examine your colon directly (sigmoidoscopy or colonoscopy). This is done to check for the earliest forms of colorectal  cancer.  Routine screening usually begins at age 50.  Direct examination of the colon should be repeated every 5-10 years through 41 years of age. However, you may need to be screened more often if early forms of precancerous polyps or small growths are found. Skin Cancer  Check your skin from head to toe regularly.  Tell your health care provider about any new moles or changes in moles, especially if there is a change in a mole's shape or color.  Also tell your health care provider if you have a mole that is larger than the size of a pencil eraser.  Always use sunscreen. Apply sunscreen liberally and repeatedly throughout the day.  Protect yourself by wearing long sleeves, pants, a wide-brimmed hat, and sunglasses whenever you are outside. HEART DISEASE, DIABETES, AND HIGH BLOOD PRESSURE   High blood pressure causes heart disease and increases the risk of stroke. High blood pressure is more likely to develop in:  People who have blood pressure in the high end   of the normal range (130-139/85-89 mm Hg).  People who are overweight or obese.  People who are African American.  If you are 38-23 years of age, have your blood pressure checked every 3-5 years. If you are 61 years of age or older, have your blood pressure checked every year. You should have your blood pressure measured twice--once when you are at a hospital or clinic, and once when you are not at a hospital or clinic. Record the average of the two measurements. To check your blood pressure when you are not at a hospital or clinic, you can use:  An automated blood pressure machine at a pharmacy.  A home blood pressure monitor.  If you are between 45 years and 39 years old, ask your health care provider if you should take aspirin to prevent strokes.  Have regular diabetes screenings. This involves taking a blood sample to check your fasting blood sugar level.  If you are at a normal weight and have a low risk for diabetes,  have this test once every three years after 41 years of age.  If you are overweight and have a high risk for diabetes, consider being tested at a younger age or more often. PREVENTING INFECTION  Hepatitis B  If you have a higher risk for hepatitis B, you should be screened for this virus. You are considered at high risk for hepatitis B if:  You were born in a country where hepatitis B is common. Ask your health care provider which countries are considered high risk.  Your parents were born in a high-risk country, and you have not been immunized against hepatitis B (hepatitis B vaccine).  You have HIV or AIDS.  You use needles to inject street drugs.  You live with someone who has hepatitis B.  You have had sex with someone who has hepatitis B.  You get hemodialysis treatment.  You take certain medicines for conditions, including cancer, organ transplantation, and autoimmune conditions. Hepatitis C  Blood testing is recommended for:  Everyone born from [birth year] through 1966.  Anyone with known risk factors for hepatitis C. Sexually transmitted infections (STIs)  You should be screened for sexually transmitted infections (STIs) including gonorrhea and chlamydia if:  You are sexually active and are younger than 41 years of age.  You are older than 41 years of age and your health care provider tells you that you are at risk for this type of infection.  Your sexual activity has changed since you were last screened and you are at an increased risk for chlamydia or gonorrhea. Ask your health care provider if you are at risk.  If you do not have HIV, but are at risk, it may be recommended that you take a prescription medicine daily to prevent HIV infection. This is called pre-exposure prophylaxis (PrEP). You are considered at risk if:  You are sexually active and do not regularly use condoms or know the HIV status of your partner(s).  You take drugs by injection.  You are sexually  active with a partner who has HIV. Talk with your health care provider about whether you are at high risk of being infected with HIV. If you choose to begin PrEP, you should first be tested for HIV. You should then be tested every 3 months for as long as you are taking PrEP.  PREGNANCY   If you are premenopausal and you may become pregnant, ask your health care provider about preconception counseling.  If you may  become pregnant, take 400 to 800 micrograms (mcg) of folic acid every day.  If you want to prevent pregnancy, talk to your health care provider about birth control (contraception). OSTEOPOROSIS AND MENOPAUSE   Osteoporosis is a disease in which the bones lose minerals and strength with aging. This can result in serious bone fractures. Your risk for osteoporosis can be identified using a bone density scan.  If you are 40 years of age or older, or if you are at risk for osteoporosis and fractures, ask your health care provider if you should be screened.  Ask your health care provider whether you should take a calcium or vitamin D supplement to lower your risk for osteoporosis.  Menopause may have certain physical symptoms and risks.  Hormone replacement therapy may reduce some of these symptoms and risks. Talk to your health care provider about whether hormone replacement therapy is right for you.  HOME CARE INSTRUCTIONS   Schedule regular health, dental, and eye exams.  Stay current with your immunizations.   Do not use any tobacco products including cigarettes, chewing tobacco, or electronic cigarettes.  If you are pregnant, do not drink alcohol.  If you are breastfeeding, limit how much and how often you drink alcohol.  Limit alcohol intake to no more than 1 drink per day for nonpregnant women. One drink equals 12 ounces of beer, 5 ounces of wine, or 1 ounces of hard liquor.  Do not use street drugs.  Do not share needles.  Ask your health care provider for help if  you need support or information about quitting drugs.  Tell your health care provider if you often feel depressed.  Tell your health care provider if you have ever been abused or do not feel safe at home.   This information is not intended to replace advice given to you by your health care provider. Make sure you discuss any questions you have with your health care provider.   Document Released: 07/28/2010 Document Revised: 02/02/2014 Document Reviewed: 12/14/2012 Elsevier Interactive Patient Education 2016 [employer]. Levonorgestrel intrauterine device (IUD) What is this medicine? LEVONORGESTREL IUD (LEE voe nor jes trel) is a contraceptive (birth control) device. The device is placed inside the uterus by a healthcare professional. It is used to prevent pregnancy and can also be used to treat heavy bleeding that occurs during your period. Depending on the device, it can be used for 3 to 5 years. This medicine may be used for other purposes; ask your health care provider or pharmacist if you have questions. What should I tell my health care provider before I take this medicine? They need to know if you have any of these conditions: -abnormal Pap smear -cancer of the breast, uterus, or cervix -diabetes -endometritis -genital or pelvic infection now or in the past -have more than one sexual partner or your partner has more than one partner -heart disease -history of an ectopic or tubal pregnancy -immune system problems -IUD in place -liver disease or tumor -problems with blood clots or take blood-thinners -use intravenous drugs -uterus of unusual shape -vaginal bleeding that has not been explained -an unusual or allergic reaction to levonorgestrel, other hormones, silicone, or polyethylene, medicines, foods, dyes, or preservatives -pregnant or trying to get pregnant -breast-feeding How should I use this medicine? This device is placed inside the uterus by a health care  professional. Talk to your pediatrician regarding the use of this medicine in children. Special care may be needed. Overdosage: If you think  you have taken too much of this medicine contact a poison control center or emergency room at once. NOTE: This medicine is only for you. Do not share this medicine with others. What if I miss a dose? This does not apply. What may interact with this medicine? Do not take this medicine with any of the following medications: -amprenavir -bosentan -fosamprenavir This medicine may also interact with the following medications: -aprepitant -barbiturate medicines for inducing sleep or treating seizures -bexarotene -griseofulvin -medicines to treat seizures like carbamazepine, ethotoin, felbamate, oxcarbazepine, phenytoin, topiramate -modafinil -pioglitazone -rifabutin -rifampin -rifapentine -some medicines to treat HIV infection like atazanavir, indinavir, lopinavir, nelfinavir, tipranavir, ritonavir -St. John's wort -warfarin This list may not describe all possible interactions. Give your health care provider a list of all the medicines, herbs, non-prescription drugs, or dietary supplements you use. Also tell them if you smoke, drink alcohol, or use illegal drugs. Some items may interact with your medicine. What should I watch for while using this medicine? Visit your doctor or health care professional for regular check ups. See your doctor if you or your partner has sexual contact with others, becomes HIV positive, or gets a sexual transmitted disease. This product does not protect you against HIV infection (AIDS) or other sexually transmitted diseases. You can check the placement of the IUD yourself by reaching up to the top of your vagina with clean fingers to feel the threads. Do not pull on the threads. It is a good habit to check placement after each menstrual period. Call your doctor right away if you feel more of the IUD than just the threads or if  you cannot feel the threads at all. The IUD may come out by itself. You may become pregnant if the device comes out. If you notice that the IUD has come out use a backup birth control method like condoms and call your health care provider. Using tampons will not change the position of the IUD and are okay to use during your period. What side effects may I notice from receiving this medicine? Side effects that you should report to your doctor or health care professional as soon as possible: -allergic reactions like skin rash, itching or hives, swelling of the face, lips, or tongue -fever, flu-like symptoms -genital sores -high blood pressure -no menstrual period for 6 weeks during use -pain, swelling, warmth in the leg -pelvic pain or tenderness -severe or sudden headache -signs of pregnancy -stomach cramping -sudden shortness of breath -trouble with balance, talking, or walking -unusual vaginal bleeding, discharge -yellowing of the eyes or skin Side effects that usually do not require medical attention (report to your doctor or health care professional if they continue or are bothersome): -acne -breast pain -change in sex drive or performance -changes in weight -cramping, dizziness, or faintness while the device is being inserted -headache -irregular menstrual bleeding within first 3 to 6 months of use -nausea This list may not describe all possible side effects. Call your doctor for medical advice about side effects. You may report side effects to FDA at 1-800-FDA-1088. Where should I keep my medicine? This does not apply. NOTE: This sheet is a summary. It may not cover all possible information. If you have questions about this medicine, talk to your doctor, pharmacist, or health care provider.    2016, Elsevier/Gold Standard. (2011-02-12 13:54:04)

## 2015-04-05 ENCOUNTER — Telehealth: Payer: Self-pay | Admitting: Gynecology

## 2015-04-05 ENCOUNTER — Telehealth: Payer: Self-pay | Admitting: *Deleted

## 2015-04-05 LAB — URINALYSIS W MICROSCOPIC + REFLEX CULTURE
BACTERIA UA: NONE SEEN [HPF]
BILIRUBIN URINE: NEGATIVE
CASTS: NONE SEEN [LPF]
CRYSTALS: NONE SEEN [HPF]
Glucose, UA: NEGATIVE
KETONES UR: NEGATIVE
Nitrite: NEGATIVE
PROTEIN: NEGATIVE
SPECIFIC GRAVITY, URINE: 1.024 (ref 1.001–1.035)
Yeast: NONE SEEN [HPF]
pH: 7.5 (ref 5.0–8.0)

## 2015-04-05 NOTE — Addendum Note (Signed)
Addended by: Thurnell Garbe A on: 04/05/2015 08:44 AM   Modules accepted: Orders, SmartSet

## 2015-04-05 NOTE — Telephone Encounter (Signed)
04/05/15-Pt was advised today that her [place] ins will cover the Mirena & insertion at 100% for contraception, no copay/deductible. She will call to schedule with TF. [phone]

## 2015-04-05 NOTE — Telephone Encounter (Signed)
Know we do not need to wait for her cycle she can have it placed prior to the end of 3 months, she has been on Depo-Provera and amenorrheic. We are checking for coverage and she can have it placed whenever convienent.

## 2015-04-05 NOTE — Telephone Encounter (Signed)
Pt benefits were checked for Mirena IUD, has been on depo-provera for 14 years, patient told to call on day 1 of cycle. Pt received her injection on office visit yesterday for depo provera, pt asked should she wait 3 months to have placement of IUD? And if no period can she still have placed, will Serum Qualitative blood drawn to confirm she is not pregnant? Please advise

## 2015-04-05 NOTE — Telephone Encounter (Signed)
Pt informed with the below note.

## 2015-04-08 ENCOUNTER — Other Ambulatory Visit: Payer: Self-pay | Admitting: Women's Health

## 2015-04-08 LAB — URINE CULTURE: Colony Count: >=100000

## 2015-04-08 MED ORDER — CIPROFLOXACIN HCL 250 MG PO TABS: 250.0000 mg | ORAL_TABLET | Freq: Two times a day (BID) | ORAL | Status: DC

## 2015-11-28 ENCOUNTER — Other Ambulatory Visit: Payer: Self-pay | Admitting: Women's Health

## 2015-11-28 DIAGNOSIS — Z1231 Encounter for screening mammogram for malignant neoplasm of breast: Secondary | ICD-10-CM

## 2016-01-21 ENCOUNTER — Ambulatory Visit
Admission: RE | Admit: 2016-01-21 | Discharge: 2016-01-21 | Disposition: A | Discharge status: Home / Self Care | Payer: Managed Care, Other (non HMO) | Source: Ambulatory Visit | Attending: Women's Health | Admitting: Women's Health

## 2016-01-21 DIAGNOSIS — Z1231 Encounter for screening mammogram for malignant neoplasm of breast: Secondary | ICD-10-CM

## 2016-04-07 ENCOUNTER — Ambulatory Visit (INDEPENDENT_AMBULATORY_CARE_PROVIDER_SITE_OTHER): Payer: 59 | Admitting: Women's Health

## 2016-04-07 ENCOUNTER — Encounter: Payer: Self-pay | Admitting: Women's Health

## 2016-04-07 VITALS — BP 127/80 | Ht 61.0 in | Wt 217.2 lb

## 2016-04-07 DIAGNOSIS — Z3042 Encounter for surveillance of injectable contraceptive: Secondary | ICD-10-CM | POA: Diagnosis not present

## 2016-04-07 DIAGNOSIS — Z01419 Encounter for gynecological examination (general) (routine) without abnormal findings: Secondary | ICD-10-CM

## 2016-04-07 MED ORDER — MEDROXYPROGESTERONE ACETATE 150 MG/ML IM SUSP: INTRAMUSCULAR | 4 refills | Status: DC

## 2016-04-07 NOTE — Progress Notes (Signed)
Tabitha Mcconnell 42/25/1977 [id number]    History:    Presents for annual exam.  Stopped Depo-Provera February 2017 amenorrheic for 6 month, monthly cycle since November. Cycles are heavy, changing protection 6 times daily cycles last 5-6 days.  Condoms/same partner. Normal Pap and mammogram history. Reports normal health screening labs at work.  Past medical history, past surgical history, family history and social history were all reviewed and documented in the EPIC chart. Works at [employer], Network engineer job.  Tabitha Mcconnell 15 doing well.  ROS:  A ROS was performed and pertinent positives and negatives are included.  Exam:  Vitals:   04/07/16 1600  BP: 127/80  Weight: 217 lb 3.2 oz (98.5 kg)  Height: 5' 1"  (1.549 m)   Body mass index is 41.04 kg/m.   General appearance:  Normal Thyroid:  Symmetrical, normal in size, without palpable masses or nodularity. Respiratory  Auscultation:  Clear without wheezing or rhonchi Cardiovascular  Auscultation:  Regular rate, without rubs, murmurs or gallops  Edema/varicosities:  Not grossly evident Abdominal  Soft,nontender, without masses, guarding or rebound.  Liver/spleen:  No organomegaly noted  Hernia:  None appreciated  Skin  Inspection:  Grossly normal   Breasts: Examined lying and sitting.     Right: Without masses, retractions, discharge or axillary adenopathy.     Left: Without masses, retractions, discharge or axillary adenopathy. Gentitourinary   Inguinal/mons:  Normal without inguinal adenopathy  External genitalia:  Normal  BUS/Urethra/Skene's glands:  Normal  Vagina:  Normal  Cervix:  Normal  Uterus:   normal in size, shape and contour.  Midline and mobile  Adnexa/parametria:     Rt: Without masses or tenderness.   Lt: Without masses or tenderness.  Anus and perineum: Normal  Digital rectal exam: Normal sphincter tone without palpated masses or tenderness  Assessment/Plan:  41 y.o. SBF G1P1 for annual exam.    Monthly  cycle/menorrhagia/condoms Obesity Contraception management Labs-health screening at work  Plan: Contraception options reviewed, declines will continue condoms. Discussed Mirena IUD, risks of infection, perforation or hemorrhage, probable lighter cycles.. Reviewed ablation is not contraceptive would need to use other contraception also. Sonohysterogram prior to ablation. SBE's, continue annual screening mammogram, calcium rich diet, vitamin D 1000 daily encouraged. Reviewed importance of regular exercise, less calories/carbs for weight loss. Pap with HR HPV typing, new screening guidelines reviewed.    [place], 4:56 PM 04/07/2016

## 2016-04-07 NOTE — Patient Instructions (Signed)
Levonorgestrel intrauterine device (IUD) What is this medicine? LEVONORGESTREL IUD (LEE voe nor jes trel) is a contraceptive (birth control) device. The device is placed inside the uterus by a healthcare professional. It is used to prevent pregnancy. This device can also be used to treat heavy bleeding that occurs during your period. This medicine may be used for other purposes; ask your health care provider or pharmacist if you have questions. COMMON BRAND NAME(S): Minette Headland What should I tell my health care provider before I take this medicine? They need to know if you have any of these conditions: -abnormal Pap smear -cancer of the breast, uterus, or cervix -diabetes -endometritis -genital or pelvic infection now or in the past -have more than one sexual partner or your partner has more than one partner -heart disease -history of an ectopic or tubal pregnancy -immune system problems -IUD in place -liver disease or tumor -problems with blood clots or take blood-thinners -seizures -use intravenous drugs -uterus of unusual shape -vaginal bleeding that has not been explained -an unusual or allergic reaction to levonorgestrel, other hormones, silicone, or polyethylene, medicines, foods, dyes, or preservatives -pregnant or trying to get pregnant -breast-feeding How should I use this medicine? This device is placed inside the uterus by a health care professional. Talk to your pediatrician regarding the use of this medicine in children. Special care may be needed. Overdosage: If you think you have taken too much of this medicine contact a poison control center or emergency room at once. NOTE: This medicine is only for you. Do not share this medicine with others. What if I miss a dose? This does not apply. Depending on the brand of device you have inserted, the device will need to be replaced every 3 to 5 years if you wish to continue using this type of birth  control. What may interact with this medicine? Do not take this medicine with any of the following medications: -amprenavir -bosentan -fosamprenavir This medicine may also interact with the following medications: -aprepitant -armodafinil -barbiturate medicines for inducing sleep or treating seizures -bexarotene -boceprevir -griseofulvin -medicines to treat seizures like carbamazepine, ethotoin, felbamate, oxcarbazepine, phenytoin, topiramate -modafinil -pioglitazone -rifabutin -rifampin -rifapentine -some medicines to treat HIV infection like atazanavir, efavirenz, indinavir, lopinavir, nelfinavir, tipranavir, ritonavir -St. John's wort -warfarin This list may not describe all possible interactions. Give your health care provider a list of all the medicines, herbs, non-prescription drugs, or dietary supplements you use. Also tell them if you smoke, drink alcohol, or use illegal drugs. Some items may interact with your medicine. What should I watch for while using this medicine? Visit your doctor or health care professional for regular check ups. See your doctor if you or your partner has sexual contact with others, becomes HIV positive, or gets a sexual transmitted disease. This product does not protect you against HIV infection (AIDS) or other sexually transmitted diseases. You can check the placement of the IUD yourself by reaching up to the top of your vagina with clean fingers to feel the threads. Do not pull on the threads. It is a good habit to check placement after each menstrual period. Call your doctor right away if you feel more of the IUD than just the threads or if you cannot feel the threads at all. The IUD may come out by itself. You may become pregnant if the device comes out. If you notice that the IUD has come out use a backup birth control method like condoms and call your  health care provider. Using tampons will not change the position of the IUD and are okay to use  during your period. This IUD can be safely scanned with magnetic resonance imaging (MRI) only under specific conditions. Before you have an MRI, tell your healthcare provider that you have an IUD in place, and which type of IUD you have in place. What side effects may I notice from receiving this medicine? Side effects that you should report to your doctor or health care professional as soon as possible: -allergic reactions like skin rash, itching or hives, swelling of the face, lips, or tongue -fever, flu-like symptoms -genital sores -high blood pressure -no menstrual period for 6 weeks during use -pain, swelling, warmth in the leg -pelvic pain or tenderness -severe or sudden headache -signs of pregnancy -stomach cramping -sudden shortness of breath -trouble with balance, talking, or walking -unusual vaginal bleeding, discharge -yellowing of the eyes or skin Side effects that usually do not require medical attention (report to your doctor or health care professional if they continue or are bothersome): -acne -breast pain -change in sex drive or performance -changes in weight -cramping, dizziness, or faintness while the device is being inserted -headache -irregular menstrual bleeding within first 3 to 6 months of use -nausea This list may not describe all possible side effects. Call your doctor for medical advice about side effects. You may report side effects to FDA at 1-800-FDA-1088. Where should I keep my medicine? This does not apply. NOTE: This sheet is a summary. It may not cover all possible information. If you have questions about this medicine, talk to your doctor, pharmacist, or health care provider.  2018 Elsevier/Gold Standard (2015-10-25 14:14:56) Health Maintenance, Female Adopting a healthy lifestyle and getting preventive care can go a long way to promote health and wellness. Talk with your health care provider about what schedule of regular examinations is right for  you. This is a good chance for you to check in with your provider about disease prevention and staying healthy. In between checkups, there are plenty of things you can do on your own. Experts have done a lot of research about which lifestyle changes and preventive measures are most likely to keep you healthy. Ask your health care provider for more information. Weight and diet Eat a healthy diet  Be sure to include plenty of vegetables, fruits, low-fat dairy products, and lean protein.  Do not eat a lot of foods high in solid fats, added sugars, or salt.  Get regular exercise. This is one of the most important things you can do for your health.  Most adults should exercise for at least 150 minutes each week. The exercise should increase your heart rate and make you sweat (moderate-intensity exercise).  Most adults should also do strengthening exercises at least twice a week. This is in addition to the moderate-intensity exercise. Maintain a healthy weight  Body mass index (BMI) is a measurement that can be used to identify possible weight problems. It estimates body fat based on height and weight. Your health care provider can help determine your BMI and help you achieve or maintain a healthy weight.  For females 72 years of age and older:  A BMI below 18.5 is considered underweight.  A BMI of 18.5 to 24.9 is normal.  A BMI of 25 to 29.9 is considered overweight.  A BMI of 30 and above is considered obese. Watch levels of cholesterol and blood lipids  You should start having your blood tested  for lipids and cholesterol at 42 years of age, then have this test every 5 years.  You may need to have your cholesterol levels checked more often if:  Your lipid or cholesterol levels are high.  You are older than 42 years of age.  You are at high risk for heart disease. Cancer screening Lung Cancer  Lung cancer screening is recommended for adults 60-73 years old who are at high risk for  lung cancer because of a history of smoking.  A yearly low-dose CT scan of the lungs is recommended for people who:  Currently smoke.  Have quit within the past 15 years.  Have at least a 30-pack-year history of smoking. A pack year is smoking an average of one pack of cigarettes a day for 1 year.  Yearly screening should continue until it has been 15 years since you quit.  Yearly screening should stop if you develop a health problem that would prevent you from having lung cancer treatment. Breast Cancer  Practice breast self-awareness. This means understanding how your breasts normally appear and feel.  It also means doing regular breast self-exams. Let your health care provider know about any changes, no matter how small.  If you are in your 20s or 30s, you should have a clinical breast exam (CBE) by a health care provider every 1-3 years as part of a regular health exam.  If you are 41 or older, have a CBE every year. Also consider having a breast X-ray (mammogram) every year.  If you have a family history of breast cancer, talk to your health care provider about genetic screening.  If you are at high risk for breast cancer, talk to your health care provider about having an MRI and a mammogram every year.  Breast cancer gene (BRCA) assessment is recommended for women who have family members with BRCA-related cancers. BRCA-related cancers include:  Breast.  Ovarian.  Tubal.  Peritoneal cancers.  Results of the assessment will determine the need for genetic counseling and BRCA1 and BRCA2 testing. Cervical Cancer  Your health care provider may recommend that you be screened regularly for cancer of the pelvic organs (ovaries, uterus, and vagina). This screening involves a pelvic examination, including checking for microscopic changes to the surface of your cervix (Pap test). You may be encouraged to have this screening done every 3 years, beginning at age 31.  For women ages  20-65, health care providers may recommend pelvic exams and Pap testing every 3 years, or they may recommend the Pap and pelvic exam, combined with testing for human papilloma virus (HPV), every 5 years. Some types of HPV increase your risk of cervical cancer. Testing for HPV may also be done on women of any age with unclear Pap test results.  Other health care providers may not recommend any screening for nonpregnant women who are considered low risk for pelvic cancer and who do not have symptoms. Ask your health care provider if a screening pelvic exam is right for you.  If you have had past treatment for cervical cancer or a condition that could lead to cancer, you need Pap tests and screening for cancer for at least 20 years after your treatment. If Pap tests have been discontinued, your risk factors (such as having a new sexual partner) need to be reassessed to determine if screening should resume. Some women have medical problems that increase the chance of getting cervical cancer. In these cases, your health care provider may recommend more frequent  screening and Pap tests. Colorectal Cancer  This type of cancer can be detected and often prevented.  Routine colorectal cancer screening usually begins at 42 years of age and continues through 42 years of age.  Your health care provider may recommend screening at an earlier age if you have risk factors for colon cancer.  Your health care provider may also recommend using home test kits to check for hidden blood in the stool.  A small camera at the end of a tube can be used to examine your colon directly (sigmoidoscopy or colonoscopy). This is done to check for the earliest forms of colorectal cancer.  Routine screening usually begins at age 40.  Direct examination of the colon should be repeated every 5-10 years through 42 years of age. However, you may need to be screened more often if early forms of precancerous polyps or small growths are  found. Skin Cancer  Check your skin from head to toe regularly.  Tell your health care provider about any new moles or changes in moles, especially if there is a change in a mole's shape or color.  Also tell your health care provider if you have a mole that is larger than the size of a pencil eraser.  Always use sunscreen. Apply sunscreen liberally and repeatedly throughout the day.  Protect yourself by wearing long sleeves, pants, a wide-brimmed hat, and sunglasses whenever you are outside. Heart disease, diabetes, and high blood pressure  High blood pressure causes heart disease and increases the risk of stroke. High blood pressure is more likely to develop in:  People who have blood pressure in the high end of the normal range (130-139/85-89 mm Hg).  People who are overweight or obese.  People who are African American.  If you are 35-90 years of age, have your blood pressure checked every 3-5 years. If you are 4 years of age or older, have your blood pressure checked every year. You should have your blood pressure measured twice-once when you are at a hospital or clinic, and once when you are not at a hospital or clinic. Record the average of the two measurements. To check your blood pressure when you are not at a hospital or clinic, you can use:  An automated blood pressure machine at a pharmacy.  A home blood pressure monitor.  If you are between 72 years and 64 years old, ask your health care provider if you should take aspirin to prevent strokes.  Have regular diabetes screenings. This involves taking a blood sample to check your fasting blood sugar level.  If you are at a normal weight and have a low risk for diabetes, have this test once every three years after 42 years of age.  If you are overweight and have a high risk for diabetes, consider being tested at a younger age or more often. Preventing infection Hepatitis B  If you have a higher risk for hepatitis B, you  should be screened for this virus. You are considered at high risk for hepatitis B if:  You were born in a country where hepatitis B is common. Ask your health care provider which countries are considered high risk.  Your parents were born in a high-risk country, and you have not been immunized against hepatitis B (hepatitis B vaccine).  You have HIV or AIDS.  You use needles to inject street drugs.  You live with someone who has hepatitis B.  You have had sex with someone who has hepatitis  B.  You get hemodialysis treatment.  You take certain medicines for conditions, including cancer, organ transplantation, and autoimmune conditions. Hepatitis C  Blood testing is recommended for:  Everyone born from [birth year] through 1966.  Anyone with known risk factors for hepatitis C. Sexually transmitted infections (STIs)  You should be screened for sexually transmitted infections (STIs) including gonorrhea and chlamydia if:  You are sexually active and are younger than 42 years of age.  You are older than 42 years of age and your health care provider tells you that you are at risk for this type of infection.  Your sexual activity has changed since you were last screened and you are at an increased risk for chlamydia or gonorrhea. Ask your health care provider if you are at risk.  If you do not have HIV, but are at risk, it may be recommended that you take a prescription medicine daily to prevent HIV infection. This is called pre-exposure prophylaxis (PrEP). You are considered at risk if:  You are sexually active and do not regularly use condoms or know the HIV status of your partner(s).  You take drugs by injection.  You are sexually active with a partner who has HIV. Talk with your health care provider about whether you are at high risk of being infected with HIV. If you choose to begin PrEP, you should first be tested for HIV. You should then be tested every 3 months for as long as you  are taking PrEP. Pregnancy  If you are premenopausal and you may become pregnant, ask your health care provider about preconception counseling.  If you may become pregnant, take 400 to 800 micrograms (mcg) of folic acid every day.  If you want to prevent pregnancy, talk to your health care provider about birth control (contraception). Osteoporosis and menopause  Osteoporosis is a disease in which the bones lose minerals and strength with aging. This can result in serious bone fractures. Your risk for osteoporosis can be identified using a bone density scan.  If you are 54 years of age or older, or if you are at risk for osteoporosis and fractures, ask your health care provider if you should be screened.  Ask your health care provider whether you should take a calcium or vitamin D supplement to lower your risk for osteoporosis.  Menopause may have certain physical symptoms and risks.  Hormone replacement therapy may reduce some of these symptoms and risks. Talk to your health care provider about whether hormone replacement therapy is right for you. Follow these instructions at home:  Schedule regular health, dental, and eye exams.  Stay current with your immunizations.  Do not use any tobacco products including cigarettes, chewing tobacco, or electronic cigarettes.  If you are pregnant, do not drink alcohol.  If you are breastfeeding, limit how much and how often you drink alcohol.  Limit alcohol intake to no more than 1 drink per day for nonpregnant women. One drink equals 12 ounces of beer, 5 ounces of wine, or 1 ounces of hard liquor.  Do not use street drugs.  Do not share needles.  Ask your health care provider for help if you need support or information about quitting drugs.  Tell your health care provider if you often feel depressed.  Tell your health care provider if you have ever been abused or do not feel safe at home. This information is not intended to replace  advice given to you by your health care provider. Make sure you  discuss any questions you have with your health care provider. Document Released: 07/28/2010 Document Revised: 06/20/2015 Document Reviewed: 10/16/2014 Elsevier Interactive Patient Education  2017 [employer].

## 2016-04-09 ENCOUNTER — Telehealth: Payer: Self-pay

## 2016-04-09 LAB — PAP, TP IMAGING W/ HPV RNA, RFLX HPV TYPE 16,18/45: HPV MRNA, HIGH RISK: NOT DETECTED

## 2016-04-09 NOTE — Telephone Encounter (Signed)
Tabitha Mcconnell at [facility] called because she received a pap smear for patient and needs to know the source of collection.  Vaginal? Endocervical? Cervical?

## 2016-04-09 NOTE — Telephone Encounter (Signed)
Informed Butch Penny at [facility].

## 2016-04-09 NOTE — Telephone Encounter (Signed)
Cervical,

## 2016-06-10 ENCOUNTER — Encounter: Payer: Self-pay | Admitting: Gynecology

## 2016-08-10 ENCOUNTER — Encounter: Payer: Self-pay | Admitting: Gynecology

## 2016-08-10 ENCOUNTER — Ambulatory Visit (INDEPENDENT_AMBULATORY_CARE_PROVIDER_SITE_OTHER): Payer: 59 | Admitting: Gynecology

## 2016-08-10 VITALS — BP 124/80

## 2016-08-10 DIAGNOSIS — F320 Major depressive disorder, single episode, mild: Secondary | ICD-10-CM | POA: Diagnosis not present

## 2016-08-10 DIAGNOSIS — N921 Excessive and frequent menstruation with irregular cycle: Secondary | ICD-10-CM | POA: Diagnosis not present

## 2016-08-10 DIAGNOSIS — N912 Amenorrhea, unspecified: Secondary | ICD-10-CM

## 2016-08-10 DIAGNOSIS — F419 Anxiety disorder, unspecified: Secondary | ICD-10-CM

## 2016-08-10 DIAGNOSIS — N760 Acute vaginitis: Secondary | ICD-10-CM

## 2016-08-10 DIAGNOSIS — B9689 Other specified bacterial agents as the cause of diseases classified elsewhere: Secondary | ICD-10-CM | POA: Diagnosis not present

## 2016-08-10 DIAGNOSIS — N898 Other specified noninflammatory disorders of vagina: Secondary | ICD-10-CM

## 2016-08-10 LAB — WET PREP FOR TRICH, YEAST, CLUE
TRICH WET PREP: NONE SEEN
YEAST WET PREP: NONE SEEN

## 2016-08-10 LAB — PREGNANCY, URINE: PREG TEST UR: NEGATIVE

## 2016-08-10 LAB — URINALYSIS W MICROSCOPIC + REFLEX CULTURE
BILIRUBIN URINE: NEGATIVE
CASTS: NONE SEEN [LPF]
Crystals: NONE SEEN [HPF]
Glucose, UA: NEGATIVE
Ketones, ur: NEGATIVE
LEUKOCYTES UA: NEGATIVE
NITRITE: NEGATIVE
Protein, ur: NEGATIVE
RBC / HPF: NONE SEEN RBC/HPF (ref <=2)
SPECIFIC GRAVITY, URINE: 1.01 (ref 1.001–1.035)
WBC, UA: NONE SEEN WBC/HPF (ref <=5)
YEAST: NONE SEEN [HPF]
pH: 7 (ref 5.0–8.0)

## 2016-08-10 MED ORDER — TINIDAZOLE 500 MG PO TABS: ORAL_TABLET | ORAL | 0 refills | Status: DC

## 2016-08-10 MED ORDER — SERTRALINE HCL 50 MG PO TABS: 50.0000 mg | ORAL_TABLET | Freq: Every day | ORAL | 5 refills | Status: DC

## 2016-08-10 NOTE — Patient Instructions (Addendum)
Endometrial Ablation Endometrial ablation is a procedure that destroys the thin inner layer of the lining of the uterus (endometrium). This procedure may be done:  To stop heavy periods.  To stop bleeding that is causing anemia.  To control irregular bleeding.  To treat bleeding caused by small tumors (fibroids) in the endometrium.  This procedure is often an alternative to major surgery, such as removal of the uterus and cervix (hysterectomy). As a result of this procedure:  You may not be able to have children. However, if you are premenopausal (you have not gone through menopause): ? You may still have a small chance of getting pregnant. ? You will need to use a reliable method of birth control after the procedure to prevent pregnancy.  You may stop having a menstrual period, or you may have only a small amount of bleeding during your period. Menstruation may return several years after the procedure.  Tell a health care provider about:  Any allergies you have.  All medicines you are taking, including vitamins, herbs, eye drops, creams, and over-the-counter medicines.  Any problems you or family members have had with the use of anesthetic medicines.  Any blood disorders you have.  Any surgeries you have had.  Any medical conditions you have. What are the risks? Generally, this is a safe procedure. However, problems may occur, including:  A hole (perforation) in the uterus or bowel.  Infection of the uterus, bladder, or vagina.  Bleeding.  Damage to other structures or organs.  An air bubble in the lung (air embolus).  Problems with pregnancy after the procedure.  Failure of the procedure.  Decreased ability to diagnose cancer in the endometrium.  What happens before the procedure?  You will have tests of your endometrium to make sure there are no pre-cancerous cells or cancer cells present.  You may have an ultrasound of the uterus.  You may be given  medicines to thin the endometrium.  Ask your health care provider about: ? Changing or stopping your regular medicines. This is especially important if you take diabetes medicines or blood thinners. ? Taking medicines such as aspirin and ibuprofen. These medicines can thin your blood. Do not take these medicines before your procedure if your doctor tells you not to.  Plan to have someone take you home from the hospital or clinic. What happens during the procedure?  You will lie on an exam table with your feet and legs supported as in a pelvic exam.  To lower your risk of infection: ? Your health care team will wash or sanitize their hands and put on germ-free (sterile) gloves. ? Your genital area will be washed with soap.  An IV tube will be inserted into one of your veins.  You will be given a medicine to help you relax (sedative).  A surgical instrument with a light and camera (resectoscope) will be inserted into your vagina and moved into your uterus. This allows your surgeon to see inside your uterus.  Endometrial tissue will be removed using one of the following methods: ? Radiofrequency. This method uses a radiofrequency-alternating electric current to remove the endometrium. ? Cryotherapy. This method uses extreme cold to freeze the endometrium. ? Heated-free liquid. This method uses a heated saltwater (saline) solution to remove the endometrium. ? Microwave. This method uses high-energy microwaves to heat up the endometrium and remove it. ? Thermal balloon. This method involves inserting a catheter with a balloon tip into the uterus. The balloon tip is  filled with heated fluid to remove the endometrium. The procedure may vary among health care providers and hospitals. What happens after the procedure?  Your blood pressure, heart rate, breathing rate, and blood oxygen level will be monitored until the medicines you were given have worn off.  As tissue healing occurs, you may  notice vaginal bleeding for 4-6 weeks after the procedure. You may also experience: ? Cramps. ? Thin, watery vaginal discharge that is light pink or brown in color. ? A need to urinate more frequently than usual. ? Nausea.  Do not drive for 24 hours if you were given a sedative.  Do not have sex or insert anything into your vagina until your health care provider approves. Summary  Endometrial ablation is done to treat the many causes of heavy menstrual bleeding.  The procedure may be done only after medications have been tried to control the bleeding.  Plan to have someone take you home from the hospital or clinic. This information is not intended to replace advice given to you by your health care provider. Make sure you discuss any questions you have with your health care provider. Document Released: 11/22/2003 Document Revised: 01/30/2016 Document Reviewed: 01/30/2016 Elsevier Interactive Patient Education  2017 [employer] Tinidazole tablets What is this medicine? TINIDAZOLE (tye NI da zole) is an antiinfective. It is used to treat amebiasis, giardiasis, trichomoniasis, and vaginosis. It will not work for colds, flu, or other viral infections. This medicine may be used for other purposes; ask your health care provider or pharmacist if you have questions. COMMON BRAND NAME(S): Tindamax What should I tell my health care provider before I take this medicine? They need to know if you have any of these conditions: -anemia or other blood disorders -if you frequently drink alcohol containing drinks -receiving hemodialysis -seizure disorder -an unusual or allergic reaction to tinidazole, other medicines, foods, dyes, or preservatives -pregnant or trying to get pregnant -breast-feeding How should I use this medicine? Take this medicine by mouth with a full glass of water. Follow the directions on the prescription label. Take with food. Take your medicine at regular intervals. Do not  take your medicine more often than directed. Take all of your medicine as directed even if you think you are better. Do not skip doses or stop your medicine early. Talk to your pediatrician regarding the use of this medicine in children. While this drug may be prescribed for children as young as 52 years of age for selected conditions, precautions do apply. Overdosage: If you think you have taken too much of this medicine contact a poison control center or emergency room at once. NOTE: This medicine is only for you. Do not share this medicine with others. What if I miss a dose? If you miss a dose, take it as soon as you can. If it is almost time for your next dose, take only that dose. Do not take double or extra doses. What may interact with this medicine? Do not take this medicine with any of the following medications: -alcohol or any product that contains alcohol -amprenavir oral solution -disulfiram -paclitaxel injection -ritonavir oral solution -sertraline oral solution -sulfamethoxazole-trimethoprim injection This medicine may also interact with the following medications: -cholestyramine -cimetidine -conivaptan -cyclosporin -fluorouracil -fosphenytoin, phenytoin -ketoconazole -lithium -phenobarbital -tacrolimus -warfarin This list may not describe all possible interactions. Give your health care provider a list of all the medicines, herbs, non-prescription drugs, or dietary supplements you use. Also tell them if you smoke, drink alcohol, or  use illegal drugs. Some items may interact with your medicine. What should I watch for while using this medicine? Tell your doctor or health care professional if your symptoms do not improve or if they get worse. Avoid alcoholic drinks while you are taking this medicine and for three days afterward. Alcohol may make you feel dizzy, sick, or flushed. If you are being treated for a sexually transmitted disease, avoid sexual contact until you have  finished your treatment. Your sexual partner may also need treatment. What side effects may I notice from receiving this medicine? Side effects that you should report to your doctor or health care professional as soon as possible: -allergic reactions like skin rash, itching or hives, swelling of the face, lips, or tongue -breathing problems -confusion, depression -dark or white patches in the mouth -feeling faint or lightheaded, falls -fever, infection -numbness, tingling, pain or weakness in the hands or feet -pain when passing urine -seizures -unusually weak or tired -vaginal irritation or discharge -vomiting Side effects that usually do not require medical attention (report to your doctor or health care professional if they continue or are bothersome): -dark brown or reddish urine -diarrhea -headache -loss of appetite -metallic taste -nausea -stomach upset This list may not describe all possible side effects. Call your doctor for medical advice about side effects. You may report side effects to FDA at 1-800-FDA-1088. Where should I keep my medicine? Keep out of the reach of children. Store at room temperature between 15 and 30 degrees C (59 and 86 degrees F). Protect from light and moisture. Keep container tightly closed. Throw away any unused medicine after the expiration date. NOTE: This sheet is a summary. It may not cover all possible information. If you have questions about this medicine, talk to your doctor, pharmacist, or health care provider.  2018 Elsevier/Gold Standard (2007-10-10 15:22:28) Bacterial Vaginosis Bacterial vaginosis is a vaginal infection that occurs when the normal balance of bacteria in the vagina is disrupted. It results from an overgrowth of certain bacteria. This is the most common vaginal infection among women ages 21-44. Because bacterial vaginosis increases your risk for STIs (sexually transmitted infections), getting treated can help reduce your  risk for chlamydia, gonorrhea, herpes, and HIV (human immunodeficiency virus). Treatment is also important for preventing complications in pregnant women, because this condition can cause an early (premature) delivery. What are the causes? This condition is caused by an increase in harmful bacteria that are normally present in small amounts in the vagina. However, the reason that the condition develops is not fully understood. What increases the risk? The following factors may make you more likely to develop this condition:  Having a new sexual partner or multiple sexual partners.  Having unprotected sex.  Douching.  Having an intrauterine device (IUD).  Smoking.  Drug and alcohol abuse.  Taking certain antibiotic medicines.  Being pregnant.  You cannot get bacterial vaginosis from toilet seats, bedding, swimming pools, or contact with objects around you. What are the signs or symptoms? Symptoms of this condition include:  Grey or white vaginal discharge. The discharge can also be watery or foamy.  A fish-like odor with discharge, especially after sexual intercourse or during menstruation.  Itching in and around the vagina.  Burning or pain with urination.  Some women with bacterial vaginosis have no signs or symptoms. How is this diagnosed? This condition is diagnosed based on:  Your medical history.  A physical exam of the vagina.  Testing a sample of vaginal fluid  under a microscope to look for a large amount of bad bacteria or abnormal cells. Your health care provider may use a cotton swab or a small wooden spatula to collect the sample.  How is this treated? This condition is treated with antibiotics. These may be given as a pill, a vaginal cream, or a medicine that is put into the vagina (suppository). If the condition comes back after treatment, a second round of antibiotics may be needed. Follow these instructions at home: Medicines  Take over-the-counter and  prescription medicines only as told by your health care provider.  Take or use your antibiotic as told by your health care provider. Do not stop taking or using the antibiotic even if you start to feel better. General instructions  If you have a female sexual partner, tell her that you have a vaginal infection. She should see her health care provider and be treated if she has symptoms. If you have a female sexual partner, he does not need treatment.  During treatment: ? Avoid sexual activity until you finish treatment. ? Do not douche. ? Avoid alcohol as directed by your health care provider. ? Avoid breastfeeding as directed by your health care provider.  Drink enough water and fluids to keep your urine clear or pale yellow.  Keep the area around your vagina and rectum clean. ? Wash the area daily with warm water. ? Wipe yourself from front to back after using the toilet.  Keep all follow-up visits as told by your health care provider. This is important. How is this prevented?  Do not douche.  Wash the outside of your vagina with warm water only.  Use protection when having sex. This includes latex condoms and dental dams.  Limit how many sexual partners you have. To help prevent bacterial vaginosis, it is best to have sex with just one partner (monogamous).  Make sure you and your sexual partner are tested for STIs.  Wear cotton or cotton-lined underwear.  Avoid wearing tight pants and pantyhose, especially during summer.  Limit the amount of alcohol that you drink.  Do not use any products that contain nicotine or tobacco, such as cigarettes and e-cigarettes. If you need help quitting, ask your health care provider.  Do not use illegal drugs. Where to find more information:  Centers for Disease Control and Prevention: AppraiserFraud.fi  American Sexual Health Association (ASHA): www.ashastd.org  [place] Department of Health and Financial controller, Office on Women's Health:  DustingSprays.pl or SecuritiesCard.it Contact a health care provider if:  Your symptoms do not improve, even after treatment.  You have more discharge or pain when urinating.  You have a fever.  You have pain in your abdomen.  You have pain during sex.  You have vaginal bleeding between periods. Summary  Bacterial vaginosis is a vaginal infection that occurs when the normal balance of bacteria in the vagina is disrupted.  Because bacterial vaginosis increases your risk for STIs (sexually transmitted infections), getting treated can help reduce your risk for chlamydia, gonorrhea, herpes, and HIV (human immunodeficiency virus). Treatment is also important for preventing complications in pregnant women, because the condition can cause an early (premature) delivery.  This condition is treated with antibiotic medicines. These may be given as a pill, a vaginal cream, or a medicine that is put into the vagina (suppository). This information is not intended to replace advice given to you by your health care provider. Make sure you discuss any questions you have with your health care provider.  Document Released: 01/12/2005 Document Revised: 09/28/2015 Document Reviewed: 09/28/2015 Elsevier Interactive Patient Education  2017 Elsevier Inc.  Sertraline tablets What is this medicine? SERTRALINE (SER tra leen) is used to treat depression. It may also be used to treat obsessive compulsive disorder, panic disorder, post-trauma stress, premenstrual dysphoric disorder (PMDD) or social anxiety. This medicine may be used for other purposes; ask your health care provider or pharmacist if you have questions. COMMON BRAND NAME(S): Zoloft What should I tell my health care provider before I take this medicine? They need to know if you have any of these conditions: -bleeding disorders -bipolar disorder or a family history of bipolar disorder -glaucoma -heart  disease -high blood pressure -history of irregular heartbeat -history of low levels of calcium, magnesium, or potassium in the blood -if you often drink alcohol -liver disease -receiving electroconvulsive therapy -seizures -suicidal thoughts, plans, or attempt; a previous suicide attempt by you or a family member -take medicines that treat or prevent blood clots -thyroid disease -an unusual or allergic reaction to sertraline, other medicines, foods, dyes, or preservatives -pregnant or trying to get pregnant -breast-feeding How should I use this medicine? Take this medicine by mouth with a glass of water. Follow the directions on the prescription label. You can take it with or without food. Take your medicine at regular intervals. Do not take your medicine more often than directed. Do not stop taking this medicine suddenly except upon the advice of your doctor. Stopping this medicine too quickly may cause serious side effects or your condition may worsen. A special MedGuide will be given to you by the pharmacist with each prescription and refill. Be sure to read this information carefully each time. Talk to your pediatrician regarding the use of this medicine in children. While this drug may be prescribed for children as young as 7 years for selected conditions, precautions do apply. Overdosage: If you think you have taken too much of this medicine contact a poison control center or emergency room at once. NOTE: This medicine is only for you. Do not share this medicine with others. What if I miss a dose? If you miss a dose, take it as soon as you can. If it is almost time for your next dose, take only that dose. Do not take double or extra doses. What may interact with this medicine? Do not take this medicine with any of the following medications: -cisapride -dofetilide -dronedarone -linezolid -MAOIs like Carbex, Eldepryl, Marplan, Nardil, and Parnate -methylene blue (injected into a  vein) -pimozide -thioridazine This medicine may also interact with the following medications: -alcohol -amphetamines -aspirin and aspirin-like medicines -certain medicines for depression, anxiety, or psychotic disturbances -certain medicines for fungal infections like ketoconazole, fluconazole, posaconazole, and itraconazole -certain medicines for irregular heart beat like flecainide, quinidine, propafenone -certain medicines for migraine headaches like almotriptan, eletriptan, frovatriptan, naratriptan, rizatriptan, sumatriptan, zolmitriptan -certain medicines for sleep -certain medicines for seizures like carbamazepine, valproic acid, phenytoin -certain medicines that treat or prevent blood clots like warfarin, enoxaparin, dalteparin -cimetidine -digoxin -diuretics -fentanyl -isoniazid -lithium -NSAIDs, medicines for pain and inflammation, like ibuprofen or naproxen -other medicines that prolong the QT interval (cause an abnormal heart rhythm) -rasagiline -safinamide -supplements like St. John's wort, kava kava, valerian -tolbutamide -tramadol -tryptophan This list may not describe all possible interactions. Give your health care provider a list of all the medicines, herbs, non-prescription drugs, or dietary supplements you use. Also tell them if you smoke, drink alcohol, or use illegal drugs. Some items may interact  with your medicine. What should I watch for while using this medicine? Tell your doctor if your symptoms do not get better or if they get worse. Visit your doctor or health care professional for regular checks on your progress. Because it may take several weeks to see the full effects of this medicine, it is important to continue your treatment as prescribed by your doctor. Patients and their families should watch out for new or worsening thoughts of suicide or depression. Also watch out for sudden changes in feelings such as feeling anxious, agitated, panicky,  irritable, hostile, aggressive, impulsive, severely restless, overly excited and hyperactive, or not being able to sleep. If this happens, especially at the beginning of treatment or after a change in dose, call your health care professional. Dennis Bast may get drowsy or dizzy. Do not drive, use machinery, or do anything that needs mental alertness until you know how this medicine affects you. Do not stand or sit up quickly, especially if you are an older patient. This reduces the risk of dizzy or fainting spells. Alcohol may interfere with the effect of this medicine. Avoid alcoholic drinks. Your mouth may get dry. Chewing sugarless gum or sucking hard candy, and drinking plenty of water may help. Contact your doctor if the problem does not go away or is severe. What side effects may I notice from receiving this medicine? Side effects that you should report to your doctor or health care professional as soon as possible: -allergic reactions like skin rash, itching or hives, swelling of the face, lips, or tongue -anxious -black, tarry stools -changes in vision -confusion -elevated mood, decreased need for sleep, racing thoughts, impulsive behavior -eye pain -fast, irregular heartbeat -feeling faint or lightheaded, falls -feeling agitated, angry, or irritable -hallucination, loss of contact with reality -loss of balance or coordination -loss of memory -painful or prolonged erections -restlessness, pacing, inability to keep still -seizures -stiff muscles -suicidal thoughts or other mood changes -trouble sleeping -unusual bleeding or bruising -unusually weak or tired -vomiting Side effects that usually do not require medical attention (report to your doctor or health care professional if they continue or are bothersome): -change in appetite or weight -change in sex drive or performance -diarrhea -increased sweating -indigestion, nausea -tremors This list may not describe all possible side  effects. Call your doctor for medical advice about side effects. You may report side effects to FDA at 1-800-FDA-1088. Where should I keep my medicine? Keep out of the reach of children. Store at room temperature between 15 and 30 degrees C (59 and 86 degrees F). Throw away any unused medicine after the expiration date. NOTE: This sheet is a summary. It may not cover all possible information. If you have questions about this medicine, talk to your doctor, pharmacist, or health care provider.  2018 Elsevier/Gold Standard (2016-01-17 14:17:49)

## 2016-08-10 NOTE — Progress Notes (Signed)
   Patient is a 41 year old who presented to the office with various complaints today. Her first complaint is having a vaginal odor no dysuria no frequency no true discharge per se. Second problem patient has complaining the past of heavy cycles had been on Depo-Provera for approximate 16 years and last injection was in November. And restarted again in March and has not had a period since then and has not come back for her next injection. She wanted to discuss also endometrial ablation. Patient also is dealing with issues with her child and is going through therapy and so she but she is not on any medication was complaining of depression and anxiety and crying spells although she has good family support and has no suicidal deviation.  Exam: Back: No CVA tenderness Abdomen: Soft nontender no rebound or guarding Pelvic: Bartholin urethra Skene was within normal limits Vagina fishy odor was noted but no true discharge. No gross lesions on inspection. Cervix: No lesions or discharge Uterus: Anteverted normal size shape and consistency Adnexa: No palpable masses or tenderness Rectal exam: Not done  Wet prep: Few clue cells, moderate white blood cell, too numerous to count bacteria  Urinalysis trace blood no bacteria no white blood cell  Urine pregnancy test negative  Assessment/plan: Problem #1: Bacterial vaginosis will be treated with Tindamax 500 mg tablets. Patient will take 4 tablets today repeat in 24 hours Problem #2 depression and anxiety will be treated with Zoloft 50 mg one by mouth daily. Risks benefits and pros and cons were discussed. Literature information was provided. It was recommended that she use condoms for contraception. 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 We discussed the evaluation recommended prior to endometrial ablation to include sonohysterogram and  endometrial biopsy which will be schedued the next couple weeks. Literature information was also provided.  Total time spent discussing all the above as well as treatment and evaluations 25 minutes

## 2016-08-11 LAB — URINE CULTURE

## 2016-08-17 ENCOUNTER — Other Ambulatory Visit: Payer: Self-pay | Admitting: Gynecology

## 2016-08-17 DIAGNOSIS — N939 Abnormal uterine and vaginal bleeding, unspecified: Secondary | ICD-10-CM

## 2016-08-26 ENCOUNTER — Other Ambulatory Visit: Payer: 59

## 2016-08-26 ENCOUNTER — Ambulatory Visit: Payer: 59 | Admitting: Gynecology

## 2016-11-16 ENCOUNTER — Other Ambulatory Visit: Payer: Self-pay | Admitting: Obstetrics & Gynecology

## 2016-11-16 DIAGNOSIS — N939 Abnormal uterine and vaginal bleeding, unspecified: Secondary | ICD-10-CM

## 2016-11-16 DIAGNOSIS — N921 Excessive and frequent menstruation with irregular cycle: Secondary | ICD-10-CM

## 2016-11-25 ENCOUNTER — Ambulatory Visit (INDEPENDENT_AMBULATORY_CARE_PROVIDER_SITE_OTHER): Payer: 59 | Admitting: Obstetrics & Gynecology

## 2016-11-25 ENCOUNTER — Ambulatory Visit (INDEPENDENT_AMBULATORY_CARE_PROVIDER_SITE_OTHER): Payer: 59

## 2016-11-25 ENCOUNTER — Other Ambulatory Visit: Payer: Self-pay | Admitting: Obstetrics & Gynecology

## 2016-11-25 DIAGNOSIS — N920 Excessive and frequent menstruation with regular cycle: Secondary | ICD-10-CM | POA: Diagnosis not present

## 2016-11-25 DIAGNOSIS — Z720 Tobacco use: Secondary | ICD-10-CM

## 2016-11-25 DIAGNOSIS — N939 Abnormal uterine and vaginal bleeding, unspecified: Secondary | ICD-10-CM

## 2016-11-25 DIAGNOSIS — N921 Excessive and frequent menstruation with irregular cycle: Secondary | ICD-10-CM

## 2016-11-25 NOTE — Patient Instructions (Signed)
1. Menorrhagia with regular cycle Management of Heavy menses discussed.  Pelvic US showing a normal Endometrial line, tri-layered at 10.5 mm.  Declines Medical/Hormonal treatments.  Estrogens CI at 42 with cigarette smoking.  Decision to proceed with Novasure Endometrial Ablation.  Will do Hysteroscopy to document a normal IU cavity.  If any lesion is present, will resect.  D+C to sample the Endometrium.  Then Novasure Endometrial Ablation.  Surgery discussed in details including preparation, procedure, risks and outcome.  Patient voiced understanding and agreement.                        Patient was counseled as to the risk of surgery to include the following:  1. Infection (prohylactic antibiotics will be administered)  2. DVT/Pulmonary Embolism (prophylactic pneumo compression stockings will be used)  3.Trauma to internal organs, from uterine perforation, requiring additional surgical procedure to repair any injury to     Internal organs requiring perhaps additional hospitalization days.  4.Hemmorhage requiring transfusion and blood products which carry risks such as anaphylactic reaction, hepatitis and AIDS  Patient had received literature information on the procedure scheduled and all her questions were answered and fully accepts all risk.  2. Tobacco abuse Recommend smoking cessation.  Will not smoke until the surgery.  Tabitha Mcconnell, it was a pleasure meeting you today!   Endometrial Ablation Endometrial ablation is a procedure that destroys the thin inner layer of the lining of the uterus (endometrium). This procedure may be done:  To stop heavy periods.  To stop bleeding that is causing anemia.  To control irregular bleeding.  To treat bleeding caused by small tumors (fibroids) in the endometrium.  This procedure is often an alternative to major surgery, such as removal of the uterus and cervix (hysterectomy). As a result of this procedure:  You may not be able to have children.  However, if you are premenopausal (you have not gone through menopause): ? You may still have a small chance of getting pregnant. ? You will need to use a reliable method of birth control after the procedure to prevent pregnancy.  You may stop having a menstrual period, or you may have only a small amount of bleeding during your period. Menstruation may return several years after the procedure.  Tell a health care provider about:  Any allergies you have.  All medicines you are taking, including vitamins, herbs, eye drops, creams, and over-the-counter medicines.  Any problems you or family members have had with the use of anesthetic medicines.  Any blood disorders you have.  Any surgeries you have had.  Any medical conditions you have. What are the risks? Generally, this is a safe procedure. However, problems may occur, including:  A hole (perforation) in the uterus or bowel.  Infection of the uterus, bladder, or vagina.  Bleeding.  Damage to other structures or organs.  An air bubble in the lung (air embolus).  Problems with pregnancy after the procedure.  Failure of the procedure.  Decreased ability to diagnose cancer in the endometrium.  What happens before the procedure?  You will have tests of your endometrium to make sure there are no pre-cancerous cells or cancer cells present.  You may have an ultrasound of the uterus.  You may be given medicines to thin the endometrium.  Ask your health care provider about: ? Changing or stopping your regular medicines. This is especially important if you take diabetes medicines or blood thinners. ? Taking medicines such as  aspirin and ibuprofen. These medicines can thin your blood. Do not take these medicines before your procedure if your doctor tells you not to.  Plan to have someone take you home from the hospital or clinic. What happens during the procedure?  You will lie on an exam table with your feet and legs  supported as in a pelvic exam.  To lower your risk of infection: ? Your health care team will wash or sanitize their hands and put on germ-free (sterile) gloves. ? Your genital area will be washed with soap.  An IV tube will be inserted into one of your veins.  You will be given a medicine to help you relax (sedative).  A surgical instrument with a light and camera (resectoscope) will be inserted into your vagina and moved into your uterus. This allows your surgeon to see inside your uterus.  Endometrial tissue will be removed using one of the following methods: ? Radiofrequency. This method uses a radiofrequency-alternating electric current to remove the endometrium. ? Cryotherapy. This method uses extreme cold to freeze the endometrium. ? Heated-free liquid. This method uses a heated saltwater (saline) solution to remove the endometrium. ? Microwave. This method uses high-energy microwaves to heat up the endometrium and remove it. ? Thermal balloon. This method involves inserting a catheter with a balloon tip into the uterus. The balloon tip is filled with heated fluid to remove the endometrium. The procedure may vary among health care providers and hospitals. What happens after the procedure?  Your blood pressure, heart rate, breathing rate, and blood oxygen level will be monitored until the medicines you were given have worn off.  As tissue healing occurs, you may notice vaginal bleeding for 4-6 weeks after the procedure. You may also experience: ? Cramps. ? Thin, watery vaginal discharge that is light pink or brown in color. ? A need to urinate more frequently than usual. ? Nausea.  Do not drive for 24 hours if you were given a sedative.  Do not have sex or insert anything into your vagina until your health care provider approves. Summary  Endometrial ablation is done to treat the many causes of heavy menstrual bleeding.  The procedure may be done only after medications have  been tried to control the bleeding.  Plan to have someone take you home from the hospital or clinic. This information is not intended to replace advice given to you by your health care provider. Make sure you discuss any questions you have with your health care provider. Document Released: 11/22/2003 Document Revised: 01/30/2016 Document Reviewed: 01/30/2016 Elsevier Interactive Patient Education  2017 [employer].

## 2016-11-25 NOTE — Progress Notes (Signed)
    Tabitha Mcconnell 1975/10/18 [id number]        41 y.o.  G1P1  RP:  Menorrhagia for Pelvic US/Sonohysto/EBx  HPI:  Heavy menses every month.  No pelvic pain.  Using condoms.  Cigarette smoker.  Past medical history,surgical history, problem list, medications, allergies, family history and social history were all reviewed and documented in the EPIC chart.  Directed ROS with pertinent positives and negatives documented in the history of present illness/assessment and plan.  Exam:  There were no vitals filed for this visit. General appearance:  Normal  Pelvic US today:  T/V Uterus anteverted homogeneous 8.82 x 4.91 x 4.32 cm.  Endometrium tri-layered at 10.5 mm, normal.  Right ovary normal.  Left ovary normal with an echo-free dominant follicle 2.5 x 1.8 cm.  No apparent mass in right or left adnexa.  No FF in CDS.   Assessment/Plan:  41 y.o. G1P1   1. Menorrhagia with regular cycle Management of Heavy menses discussed.  Pelvic US showing a normal Endometrial line, tri-layered at 10.5 mm.  Declines Medical/Hormonal treatments.  Estrogens CI at 41 with cigarette smoking.  Decision to proceed with Novasure Endometrial Ablation.  Will do Hysteroscopy to document a normal IU cavity.  If any lesion is present, will resect.  D+C to sample the Endometrium.  Then Novasure Endometrial Ablation.  Surgery discussed in details including preparation, procedure, risks and outcome.  Patient voiced understanding and agreement.                        Patient was counseled as to the risk of surgery to include the following:  1. Infection (prohylactic antibiotics will be administered)  2. DVT/Pulmonary Embolism (prophylactic pneumo compression stockings will be used)  3.Trauma to internal organs, from uterine perforation, requiring additional surgical procedure to repair any injury to     Internal organs requiring perhaps additional hospitalization days.  4.Hemmorhage requiring transfusion and blood  products which carry risks such as anaphylactic reaction, hepatitis and AIDS  Patient had received literature information on the procedure scheduled and all her questions were answered and fully accepts all risk.  2. Tobacco abuse Recommend smoking cessation.  Will not smoke until the surgery.   Counseling on above issues >50% x 25 minutes.  Princess Bruins MD, 8:44 AM 11/25/2016

## 2016-11-30 ENCOUNTER — Telehealth: Payer: Self-pay

## 2016-11-30 NOTE — Telephone Encounter (Signed)
I called and spoke with patient about ins benefits and estimated surgery prepymt amount.  We discussed dates and patient chose 01/15/17 at 7:30am at [facility].  I mailed her a Ambulance person as well as a pamphlet from [facility].

## 2016-12-07 ENCOUNTER — Encounter: Payer: Self-pay | Admitting: Anesthesiology

## 2017-01-13 ENCOUNTER — Encounter (HOSPITAL_BASED_OUTPATIENT_CLINIC_OR_DEPARTMENT_OTHER): Payer: Self-pay | Admitting: *Deleted

## 2017-01-13 ENCOUNTER — Other Ambulatory Visit: Payer: Self-pay

## 2017-01-13 NOTE — Progress Notes (Addendum)
SPOKE W/ PT VIA PHONE FOR PRE-OP INTERVIEW.  ARRIVE AT 0530.  NEEDS HG AND URINE PREG.  PRE-OP ORDERS PENDING.

## 2017-01-14 NOTE — Anesthesia Preprocedure Evaluation (Addendum)
Anesthesia Evaluation  Patient identified by MRN, date of birth, ID band Patient awake    Reviewed: Allergy & Precautions, NPO status , Patient's Chart, lab work & pertinent test results  Airway Mallampati: II  TM Distance: >3 FB Neck ROM: Full    Dental no notable dental hx. (+) Teeth Intact, Dental Advisory Given   Pulmonary neg pulmonary ROS, former smoker,    Pulmonary exam normal breath sounds clear to auscultation       Cardiovascular negative cardio ROS Normal cardiovascular exam Rhythm:Regular Rate:Normal     Neuro/Psych negative neurological ROS  negative psych ROS   GI/Hepatic negative GI ROS, Neg liver ROS,   Endo/Other  negative endocrine ROSMorbid obesity  Renal/GU negative Renal ROS  negative genitourinary   Musculoskeletal negative musculoskeletal ROS (+)   Abdominal   Peds negative pediatric ROS (+)  Hematology negative hematology ROS (+)   Anesthesia Other Findings   Reproductive/Obstetrics negative OB ROS                            Anesthesia Physical Anesthesia Plan  ASA: II  Anesthesia Plan: General   Post-op Pain Management:    Induction: Intravenous  PONV Risk Score and Plan: 3 and Ondansetron, Dexamethasone, Treatment may vary due to age or medical condition, Midazolam and Propofol infusion  Airway Management Planned: LMA and Oral ETT  Additional Equipment:   Intra-op Plan:   Post-operative Plan: Extubation in OR  Informed Consent: I have reviewed the patients History and Physical, chart, labs and discussed the procedure including the risks, benefits and alternatives for the proposed anesthesia with the patient or authorized representative who has indicated his/her understanding and acceptance.     Plan Discussed with: CRNA and Surgeon  Anesthesia Plan Comments: ( )        Anesthesia Quick Evaluation

## 2017-01-15 ENCOUNTER — Ambulatory Visit (HOSPITAL_BASED_OUTPATIENT_CLINIC_OR_DEPARTMENT_OTHER): Payer: 59 | Admitting: Anesthesiology

## 2017-01-15 ENCOUNTER — Encounter (HOSPITAL_BASED_OUTPATIENT_CLINIC_OR_DEPARTMENT_OTHER)
Admission: RE | Disposition: A | Discharge status: Home / Self Care | Payer: Self-pay | Source: Ambulatory Visit | Attending: Obstetrics & Gynecology

## 2017-01-15 ENCOUNTER — Encounter (HOSPITAL_BASED_OUTPATIENT_CLINIC_OR_DEPARTMENT_OTHER): Payer: Self-pay

## 2017-01-15 ENCOUNTER — Ambulatory Visit (HOSPITAL_BASED_OUTPATIENT_CLINIC_OR_DEPARTMENT_OTHER)
Admission: RE | Admit: 2017-01-15 | Discharge: 2017-01-15 | Disposition: A | Discharge status: Home / Self Care | Payer: 59 | Source: Ambulatory Visit | Attending: Obstetrics & Gynecology | Admitting: Obstetrics & Gynecology

## 2017-01-15 ENCOUNTER — Other Ambulatory Visit: Payer: Self-pay

## 2017-01-15 DIAGNOSIS — N920 Excessive and frequent menstruation with regular cycle: Secondary | ICD-10-CM | POA: Diagnosis not present

## 2017-01-15 DIAGNOSIS — N841 Polyp of cervix uteri: Secondary | ICD-10-CM | POA: Insufficient documentation

## 2017-01-15 DIAGNOSIS — F17210 Nicotine dependence, cigarettes, uncomplicated: Secondary | ICD-10-CM | POA: Insufficient documentation

## 2017-01-15 DIAGNOSIS — N921 Excessive and frequent menstruation with irregular cycle: Secondary | ICD-10-CM | POA: Diagnosis not present

## 2017-01-15 HISTORY — DX: Excessive and frequent menstruation with regular cycle: N92.0

## 2017-01-15 HISTORY — PX: HYSTEROSCOPY WITH D & C: SHX1775

## 2017-01-15 HISTORY — PX: HYSTEROSCOPY WITH NOVASURE: SHX5574

## 2017-01-15 LAB — CBC
HCT: 39.6 % (ref 36.0–46.0)
HEMOGLOBIN: 14 g/dL (ref 12.0–15.0)
MCH: 30.6 pg (ref 26.0–34.0)
MCHC: 35.4 g/dL (ref 30.0–36.0)
MCV: 86.5 fL (ref 78.0–100.0)
PLATELETS: 247 10*3/uL (ref 150–400)
RBC: 4.58 MIL/uL (ref 3.87–5.11)
RDW: 13.8 % (ref 11.5–15.5)
WBC: 7.2 10*3/uL (ref 4.0–10.5)

## 2017-01-15 LAB — POCT PREGNANCY, URINE: Preg Test, Ur: NEGATIVE

## 2017-01-15 SURGERY — HYSTEROSCOPY WITH NOVASURE
Anesthesia: General | Site: Vagina

## 2017-01-15 MED ORDER — FENTANYL CITRATE (PF) 100 MCG/2ML IJ SOLN
25.0000 ug | INTRAMUSCULAR | Status: DC | PRN
  Filled 2017-01-15: qty 1

## 2017-01-15 MED ORDER — ACETAMINOPHEN 325 MG PO TABS
325.0000 mg | ORAL_TABLET | ORAL | Status: DC | PRN
  Filled 2017-01-15: qty 2

## 2017-01-15 MED ORDER — KETOROLAC TROMETHAMINE 30 MG/ML IJ SOLN
30.0000 mg | Freq: Once | INTRAMUSCULAR | Status: DC | PRN
  Filled 2017-01-15: qty 1

## 2017-01-15 MED ORDER — ONDANSETRON HCL 4 MG/2ML IJ SOLN
INTRAMUSCULAR | Status: DC | PRN
  Administered 2017-01-15: 4 mg via INTRAVENOUS

## 2017-01-15 MED ORDER — FENTANYL CITRATE (PF) 100 MCG/2ML IJ SOLN
INTRAMUSCULAR | Status: AC
  Filled 2017-01-15: qty 2

## 2017-01-15 MED ORDER — KETOROLAC TROMETHAMINE 30 MG/ML IJ SOLN
INTRAMUSCULAR | Status: DC | PRN
  Administered 2017-01-15: 30 mg via INTRAVENOUS

## 2017-01-15 MED ORDER — SODIUM CHLORIDE 0.9 % IR SOLN
Status: DC | PRN
  Administered 2017-01-15: 3000 mL

## 2017-01-15 MED ORDER — MIDAZOLAM HCL 2 MG/2ML IJ SOLN
INTRAMUSCULAR | Status: DC | PRN
  Administered 2017-01-15: 2 mg via INTRAVENOUS

## 2017-01-15 MED ORDER — MEPERIDINE HCL 25 MG/ML IJ SOLN
6.2500 mg | INTRAMUSCULAR | Status: DC | PRN
  Filled 2017-01-15: qty 1

## 2017-01-15 MED ORDER — DEXAMETHASONE SODIUM PHOSPHATE 10 MG/ML IJ SOLN
INTRAMUSCULAR | Status: AC
  Filled 2017-01-15: qty 1

## 2017-01-15 MED ORDER — LIDOCAINE 2% (20 MG/ML) 5 ML SYRINGE
INTRAMUSCULAR | Status: AC
  Filled 2017-01-15: qty 5

## 2017-01-15 MED ORDER — MIDAZOLAM HCL 2 MG/2ML IJ SOLN
INTRAMUSCULAR | Status: AC
  Filled 2017-01-15: qty 2

## 2017-01-15 MED ORDER — LACTATED RINGERS IV SOLN
INTRAVENOUS | Status: DC
  Administered 2017-01-15: 07:00:00 via INTRAVENOUS
  Filled 2017-01-15: qty 1000

## 2017-01-15 MED ORDER — OXYCODONE HCL 5 MG/5ML PO SOLN
5.0000 mg | Freq: Once | ORAL | Status: DC | PRN
  Filled 2017-01-15: qty 5

## 2017-01-15 MED ORDER — LIDOCAINE 2% (20 MG/ML) 5 ML SYRINGE
INTRAMUSCULAR | Status: DC | PRN
  Administered 2017-01-15: 60 mg via INTRAVENOUS

## 2017-01-15 MED ORDER — CEFAZOLIN SODIUM-DEXTROSE 2-4 GM/100ML-% IV SOLN
INTRAVENOUS | Status: AC
  Filled 2017-01-15: qty 100

## 2017-01-15 MED ORDER — PROPOFOL 10 MG/ML IV BOLUS
INTRAVENOUS | Status: DC | PRN
  Administered 2017-01-15: 200 mg via INTRAVENOUS

## 2017-01-15 MED ORDER — ARTIFICIAL TEARS OPHTHALMIC OINT
TOPICAL_OINTMENT | OPHTHALMIC | Status: AC
  Filled 2017-01-15: qty 3.5

## 2017-01-15 MED ORDER — ONDANSETRON HCL 4 MG/2ML IJ SOLN
4.0000 mg | Freq: Once | INTRAMUSCULAR | Status: DC | PRN
  Filled 2017-01-15: qty 2

## 2017-01-15 MED ORDER — OXYCODONE HCL 5 MG PO TABS
5.0000 mg | ORAL_TABLET | Freq: Once | ORAL | Status: DC | PRN
  Filled 2017-01-15: qty 1

## 2017-01-15 MED ORDER — FENTANYL CITRATE (PF) 100 MCG/2ML IJ SOLN
INTRAMUSCULAR | Status: DC | PRN
  Administered 2017-01-15: 25 ug via INTRAVENOUS
  Administered 2017-01-15: 50 ug via INTRAVENOUS

## 2017-01-15 MED ORDER — PROPOFOL 10 MG/ML IV BOLUS
INTRAVENOUS | Status: AC
  Filled 2017-01-15: qty 40

## 2017-01-15 MED ORDER — ONDANSETRON HCL 4 MG/2ML IJ SOLN
INTRAMUSCULAR | Status: AC
  Filled 2017-01-15: qty 2

## 2017-01-15 MED ORDER — CHLOROPROCAINE HCL 1 % IJ SOLN
INTRAMUSCULAR | Status: DC | PRN
  Administered 2017-01-15: 20 mL

## 2017-01-15 MED ORDER — ACETAMINOPHEN 160 MG/5ML PO SOLN
325.0000 mg | ORAL | Status: DC | PRN
  Filled 2017-01-15: qty 20.3

## 2017-01-15 MED ORDER — OXYCODONE-ACETAMINOPHEN 7.5-325 MG PO TABS: 1.0000 | ORAL_TABLET | Freq: Four times a day (QID) | ORAL | 0 refills | Status: DC | PRN

## 2017-01-15 MED ORDER — CEFAZOLIN SODIUM-DEXTROSE 2-4 GM/100ML-% IV SOLN
2.0000 g | INTRAVENOUS | Status: AC
  Administered 2017-01-15: 2 g via INTRAVENOUS
  Filled 2017-01-15: qty 100

## 2017-01-15 MED ORDER — DEXAMETHASONE SODIUM PHOSPHATE 10 MG/ML IJ SOLN
INTRAMUSCULAR | Status: DC | PRN
  Administered 2017-01-15: 10 mg via INTRAVENOUS

## 2017-01-15 SURGICAL SUPPLY — 25 items
ABLATOR ENDOMETRIAL BIPOLAR (ABLATOR) ×3 IMPLANT
BIPOLAR CUTTING LOOP 21FR (ELECTRODE)
CANISTER SUCT 3000ML PPV (MISCELLANEOUS) ×3 IMPLANT
CATH ROBINSON RED A/P 16FR (CATHETERS) ×3 IMPLANT
CONTAINER PREFILL 10% NBF 60ML (FORM) IMPLANT
COUNTER NEEDLE 1200 MAGNETIC (NEEDLE) ×3 IMPLANT
DILATOR CANAL MILEX (MISCELLANEOUS) IMPLANT
ELECT REM PT RETURN 9FT ADLT (ELECTROSURGICAL)
ELECTRODE REM PT RTRN 9FT ADLT (ELECTROSURGICAL) IMPLANT
GLOVE BIO SURGEON STRL SZ 6.5 (GLOVE) ×2 IMPLANT
GLOVE BIO SURGEONS STRL SZ 6.5 (GLOVE) ×1
GLOVE BIOGEL PI IND STRL 7.0 (GLOVE) ×2 IMPLANT
GLOVE BIOGEL PI INDICATOR 7.0 (GLOVE) ×4
GOWN STRL REUS W/ TWL XL LVL3 (GOWN DISPOSABLE) ×1 IMPLANT
GOWN STRL REUS W/TWL LRG LVL3 (GOWN DISPOSABLE) ×6 IMPLANT
GOWN STRL REUS W/TWL XL LVL3 (GOWN DISPOSABLE) ×2
IV NS IRRIG 3000ML ARTHROMATIC (IV SOLUTION) ×3 IMPLANT
KIT RM TURNOVER CYSTO AR (KITS) ×3 IMPLANT
LOOP CUTTING BIPOLAR 21FR (ELECTRODE) IMPLANT
PACK VAGINAL MINOR WOMEN LF (CUSTOM PROCEDURE TRAY) ×3 IMPLANT
PAD OB MATERNITY 4.3X12.25 (PERSONAL CARE ITEMS) ×3 IMPLANT
TOWEL OR 17X24 6PK STRL BLUE (TOWEL DISPOSABLE) ×6 IMPLANT
TUBING AQUILEX INFLOW (TUBING) ×3 IMPLANT
TUBING AQUILEX OUTFLOW (TUBING) ×3 IMPLANT
WATER STERILE IRR 500ML POUR (IV SOLUTION) ×3 IMPLANT

## 2017-01-15 NOTE — H&P (Signed)
Tabitha Mcconnell is an 41 y.o. female.  G1P1  RP:  Menorrhagia for [place], D+C, Novasure Endometrial Ablation  HPI:  No change x last visit 11/25/2016.  Heavy menses every month.  No pelvic pain.  Using condoms.  Cigarette smoker.  Pertinent Gynecological History: Menses: flow is excessive with use of Many pads or tampons on heaviest days Contraception: condoms Blood transfusions: none Sexually transmitted diseases: no past history Last mammogram: normal  Last pap: normal OB History: G1P1   Menstrual History:  Patient's last menstrual period was 01/07/2017 (exact date).    Past Medical History:  Diagnosis Date  . Menorrhagia     Past Surgical History:  Procedure Laterality Date  . LAPAROSCOPIC CHOLECYSTECTOMY  12/2011  . RESECTION COMPLEX MYXOID CYST RIGHT WRIST  07-22-2010    dr sypher   [facility]    Family History  Problem Relation Age of Onset  . Heart disease Father   . Breast cancer Paternal Aunt        died in 48's  . Cancer Paternal Uncle        deceased  . Lymphoma Cousin   . Parkinson's disease Mother     Social History:  reports that she quit smoking about 2 years ago. Her smoking use included cigarettes. She quit after 16.00 years of use. she has never used smokeless tobacco. She reports that she drinks alcohol. She reports that she does not use drugs.  Allergies: No Known Allergies  No medications prior to admission.    REVIEW OF SYSTEMS: A ROS was performed and pertinent positives and negatives are included in the history.  GENERAL: No fevers or chills. HEENT: No change in vision, no earache, sore throat or sinus congestion. NECK: No pain or stiffness. CARDIOVASCULAR: No chest pain or pressure. No palpitations. PULMONARY: No shortness of breath, cough or wheeze. GASTROINTESTINAL: No abdominal pain, nausea, vomiting or diarrhea, melena or bright red blood per rectum. GENITOURINARY: No urinary frequency, urgency, hesitancy or dysuria. MUSCULOSKELETAL: No joint or  muscle pain, no back pain, no recent trauma. DERMATOLOGIC: No rash, no itching, no lesions. ENDOCRINE: No polyuria, polydipsia, no heat or cold intolerance. No recent change in weight. HEMATOLOGICAL: No anemia or easy bruising or bleeding. NEUROLOGIC: No headache, seizures, numbness, tingling or weakness. PSYCHIATRIC: No depression, no loss of interest in normal activity or change in sleep pattern.     Blood pressure (!) 139/92, pulse 94, temperature 97.7 F (36.5 C), temperature source Oral, resp. rate 16, height 5' 1.5" (1.562 m), weight 221 lb 8 oz (100.5 kg), last menstrual period 01/07/2017, SpO2 100 %.  Physical Exam:  See office notes  Results for orders placed or performed during the hospital encounter of 01/15/17 (from the past 24 hour(s))  Pregnancy, urine POC     Status: None   Collection Time: 01/15/17  5:52 AM  Result Value Ref Range   Preg Test, Ur NEGATIVE NEGATIVE  CBC     Status: None   Collection Time: 01/15/17  6:23 AM  Result Value Ref Range   WBC 7.2 4.0 - 10.5 K/uL   RBC 4.58 3.87 - 5.11 MIL/uL   Hemoglobin 14.0 12.0 - 15.0 g/dL   HCT 39.6 36.0 - 46.0 %   MCV 86.5 78.0 - 100.0 fL   MCH 30.6 26.0 - 34.0 pg   MCHC 35.4 30.0 - 36.0 g/dL   RDW 13.8 11.5 - 15.5 %   Platelets 247 150 - 400 K/uL   Pelvic US 11/25/2016:  T/V  Uterus anteverted homogeneous 8.82 x 4.91 x 4.32 cm.  Endometrium tri-layered at 10.5 mm, normal.  Right ovary normal.  Left ovary normal with an echo-free dominant follicle 2.5 x 1.8 cm.  No apparent mass in right or left adnexa.  No FF in CDS.   Assessment/Plan:  41 y.o. G1P1   1. Menorrhagia with regular cycle Management of Heavy menses discussed.  Pelvic US showing a normal Endometrial line, tri-layered at 10.5 mm.  Declines Medical/Hormonal treatments. Estrogens CI at 41 with cigarette smoking.  Decision to proceed with Novasure Endometrial Ablation.  Will do Hysteroscopy to document a normal IU cavity.  If any lesion is present, will  resect.  D+C to sample the Endometrium.  Then Novasure Endometrial Ablation.  Surgery discussed in details including preparation, procedure, risks and outcome.  Patient voiced understanding and agreement.                        Patient was counseled as to the risk of surgery to include the following:  1. Infection (prohylactic antibiotics will be administered)  2. DVT/Pulmonary Embolism (prophylactic pneumo compression stockings will be used)  3.Trauma to internal organs, from uterine perforation, requiring additional surgical procedure to repair any injury to     Internal organs requiring perhaps additional hospitalization days.  4.Hemmorhage requiring transfusion and blood products which carry risks such as anaphylactic reaction, hepatitis and AIDS  Patient had received literature information on the procedure scheduled and all her questions were answered and fully accepts all risk.   [place] Wally Shevchenko 01/15/2017, 7:14 AM

## 2017-01-15 NOTE — Transfer of Care (Signed)
Immediate Anesthesia Transfer of Care Note  Patient: Tabitha Mcconnell  Procedure(s) Performed: HYSTEROSCOPY WITH NOVASURE (N/A Vagina ) DILATATION AND CURETTAGE /HYSTEROSCOPY (N/A Vagina )  Patient Location: PACU  Anesthesia Type:General  Level of Consciousness: awake, alert , oriented and patient cooperative  Airway & Oxygen Therapy: Patient Spontanous Breathing and Patient connected to nasal cannula oxygen  Post-op Assessment: Report given to RN and Post -op Vital signs reviewed and stable  Post vital signs: Reviewed and stable  Last Vitals:  Vitals:   01/15/17 0543  BP: (!) 139/92  Pulse: 94  Resp: 16  Temp: 36.5 C  SpO2: 100%    Last Pain:  Vitals:   01/15/17 0543  TempSrc: Oral      Patients Stated Pain Goal: 8 (39/67/28 [id number])  Complications: No apparent anesthesia complications

## 2017-01-15 NOTE — Discharge Instructions (Addendum)
Endometrial Ablation Endometrial ablation is a procedure that destroys the thin inner layer of the lining of the uterus (endometrium). This procedure may be done:  To stop heavy periods.  To stop bleeding that is causing anemia.  To control irregular bleeding.  To treat bleeding caused by small tumors (fibroids) in the endometrium.  This procedure is often an alternative to major surgery, such as removal of the uterus and cervix (hysterectomy). As a result of this procedure:  You may not be able to have children. However, if you are premenopausal (you have not gone through menopause): ? You may still have a small chance of getting pregnant. ? You will need to use a reliable method of birth control after the procedure to prevent pregnancy.  You may stop having a menstrual period, or you may have only a small amount of bleeding during your period. Menstruation may return several years after the procedure.  Tell a health care provider about:  Any allergies you have.  All medicines you are taking, including vitamins, herbs, eye drops, creams, and over-the-counter medicines.  Any problems you or family members have had with the use of anesthetic medicines.  Any blood disorders you have.  Any surgeries you have had.  Any medical conditions you have. What are the risks? Generally, this is a safe procedure. However, problems may occur, including:  A hole (perforation) in the uterus or bowel.  Infection of the uterus, bladder, or vagina.  Bleeding.  Damage to other structures or organs.  An air bubble in the lung (air embolus).  Problems with pregnancy after the procedure.  Failure of the procedure.  Decreased ability to diagnose cancer in the endometrium.  What happens before the procedure?  You will have tests of your endometrium to make sure there are no pre-cancerous cells or cancer cells present.  You may have an ultrasound of the uterus.  You may be given  medicines to thin the endometrium.  Ask your health care provider about: ? Changing or stopping your regular medicines. This is especially important if you take diabetes medicines or blood thinners. ? Taking medicines such as aspirin and ibuprofen. These medicines can thin your blood. Do not take these medicines before your procedure if your doctor tells you not to.  Plan to have someone take you home from the hospital or clinic. What happens during the procedure?  You will lie on an exam table with your feet and legs supported as in a pelvic exam.  To lower your risk of infection: ? Your health care team will wash or sanitize their hands and put on germ-free (sterile) gloves. ? Your genital area will be washed with soap.  An IV tube will be inserted into one of your veins.  You will be given a medicine to help you relax (sedative).  A surgical instrument with a light and camera (resectoscope) will be inserted into your vagina and moved into your uterus. This allows your surgeon to see inside your uterus.  Endometrial tissue will be removed using one of the following methods: ? Radiofrequency. This method uses a radiofrequency-alternating electric current to remove the endometrium. ? Cryotherapy. This method uses extreme cold to freeze the endometrium. ? Heated-free liquid. This method uses a heated saltwater (saline) solution to remove the endometrium. ? Microwave. This method uses high-energy microwaves to heat up the endometrium and remove it. ? Thermal balloon. This method involves inserting a catheter with a balloon tip into the uterus. The balloon tip is  filled with heated fluid to remove the endometrium. The procedure may vary among health care providers and hospitals. What happens after the procedure?  Your blood pressure, heart rate, breathing rate, and blood oxygen level will be monitored until the medicines you were given have worn off.  As tissue healing occurs, you may  notice vaginal bleeding for 4-6 weeks after the procedure. You may also experience: ? Cramps. ? Thin, watery vaginal discharge that is light pink or brown in color. ? A need to urinate more frequently than usual. ? Nausea.  Do not drive for 24 hours if you were given a sedative.  Do not have sex or insert anything into your vagina until your health care provider approves. Summary  Endometrial ablation is done to treat the many causes of heavy menstrual bleeding.  The procedure may be done only after medications have been tried to control the bleeding.  Plan to have someone take you home from the hospital or clinic. This information is not intended to replace advice given to you by your health care provider. Make sure you discuss any questions you have with your health care provider. Document Released: 11/22/2003 Document Revised: 01/30/2016 Document Reviewed: 01/30/2016 Elsevier Interactive Patient Education  2017 [employer] Care Instructions  Activity: Get plenty of rest for the remainder of the day. A responsible individual must stay with you for 24 hours following the procedure.  For the next 24 hours, DO NOT: -Drive a car -Paediatric nurse -Drink alcoholic beverages -Take any medication unless instructed by your physician -Make any legal decisions or sign important papers.  Meals: Start with liquid foods such as gelatin or soup. Progress to regular foods as tolerated. Avoid greasy, spicy, heavy foods. If nausea and/or vomiting occur, drink only clear liquids until the nausea and/or vomiting subsides. Call your physician if vomiting continues.  Special Instructions/Symptoms: Your throat may feel dry or sore from the anesthesia or the breathing tube placed in your throat during surgery. If this causes discomfort, gargle with warm salt water. The discomfort should disappear within 24 hours.  If you had a scopolamine patch placed behind your ear for  the management of post- operative nausea and/or vomiting:  1. The medication in the patch is effective for 72 hours, after which it should be removed.  Wrap patch in a tissue and discard in the trash. Wash hands thoroughly with soap and water. 2. You may remove the patch earlier than 72 hours if you experience unpleasant side effects which may include dry mouth, dizziness or visual disturbances. 3. Avoid touching the patch. Wash your hands with soap and water after contact with the patch.   NO ADVIL, ALEVE, IBUPROFEN, MOTRIN UNTIL 2 PM TODAY

## 2017-01-15 NOTE — Anesthesia Procedure Notes (Signed)
Procedure Name: LMA Insertion Date/Time: 01/15/2017 7:35 AM Performed by: Wanita Chamberlain, CRNA Pre-anesthesia Checklist: Patient identified, Emergency Drugs available, Suction available, Patient being monitored and Timeout performed Patient Re-evaluated:Patient Re-evaluated prior to induction Oxygen Delivery Method: Circle system utilized Preoxygenation: Pre-oxygenation with 100% oxygen Induction Type: IV induction Ventilation: Mask ventilation without difficulty LMA: LMA inserted LMA Size: 4.0 Number of attempts: 1 Placement Confirmation: positive ETCO2 and breath sounds checked- equal and bilateral Tube secured with: Tape Dental Injury: Teeth and Oropharynx as per pre-operative assessment

## 2017-01-15 NOTE — Discharge Summary (Addendum)
Physician Discharge Summary  Patient ID: Tabitha Mcconnell MRN: [id number] DOB/AGE: 1975/07/06 41 y.o.  Admit date: 01/15/2017 Discharge date: 01/15/2017  Admission Diagnoses: menorrhagia   Discharge Diagnoses: menorrhagia, endocervical polyp Active Problems:   * No active hospital problems. *   Discharged Condition: good  Consults:None  Significant Diagnostic Studies: none  Treatments:surgery: Removal of endocervical polyp, Hysteroscopy, D+C, Novasure Endometrial Ablation  Vitals:   01/15/17 0543  BP: (!) 139/92  Pulse: 94  Resp: 16  Temp: 97.7 F (36.5 C)  SpO2: 100%     Total I/O In: 450 [I.V.:450] Out: 105 [Urine:100; Blood:5]   Hospital Course: Good  Discharge Exam: Normal  Disposition: 01-Home or Self Care   Allergies as of 01/15/2017   No Known Allergies     Medication List    TAKE these medications   oxyCODONE-acetaminophen 7.5-325 MG tablet Commonly known as:  PERCOCET Take 1 tablet by mouth every 6 (six) hours as needed for severe pain.        Follow-up Information    Princess Bruins, MD Follow up in 3 week(s).   Specialty:  Obstetrics and Gynecology Contact information: [address] [place] [address] [phone]            Signed: Princess Bruins 01/15/2017, 8:21 AM

## 2017-01-15 NOTE — Op Note (Addendum)
Operative Note  01/15/2017  8:03 AM  PATIENT:  Tabitha Mcconnell  41 y.o. female  PRE-OPERATIVE DIAGNOSIS:  Menorrhagia  POST-OPERATIVE DIAGNOSIS:  Menorrhagia, Endocervical Polyp  PROCEDURE:  Procedure(s): REMOVAL OF ENDOCERVICAL POLYP, HYSTEROSCOPY, DILATION AND CURETTAGE, NOVASURE ENDOMETRIAL ABLATION   SURGEON:  Surgeon(s): Princess Bruins, MD  ANESTHESIA:   general  FINDINGS:  Small Endocervical polyp.  Normal intrauterine cavity.  DESCRIPTION OF OPERATION: Under general anesthesia with laryngeal mask the patient is in lithotomy position.  She is prepped with Betadine on the supra pubic, vulvar and vaginal areas.  The vaginal exam reveals an anteverted uterus, normal volume and no adnexal mass.  The speculum was inserted in the vagina and the anterior lip of the cervix is grasped with a tenaculum.  A small endocervical polyp was visible at the external loss of the cervix.  It was grasped with the fenestrated clamp and removed.  A paracervical block is done with lidocaine 1% a total of 20 cc at 4 and 8:00.  The measurements of the cervical length is at 5 cm and the hysterometry at 9 cm for a cavity length of 4 cm.  Dilation of the cervix with Hegar dilators up to #27 without difficulty.  Insertion of the hysteroscope in the intrauterine cavity.  The intrauterine cavity is completely normal with both ostia well visualized.  Pictures are taken.  The hysteroscope was then removed.  We proceeded with a systematic curettage of the intrauterine cavity on all surfaces with a sharp curette.  The endocervical canal is curetted as well.  All specimens are sent together to pathology, including the endocervical polyp.  We then inserted the NovaSure instrument in the intrauterine cavity.  The width is calculated at 2.7 cm.  Security test is passed.  We then proceed with the NovaSure endometrial ablation at a power of 35 W for 2 minutes.  The instrument is then easily removed.  The tenaculum was removed  from the cervix.  The speculum is removed.  Hemostasis was adequate at all levels.  The patient is transferred to recovery room in good and stable status.  ESTIMATED BLOOD LOSS: 5 mL FLUID DEFICIT: 180 cc  Intake/Output Summary (Last 24 hours) at 01/15/2017 0803 Last data filed at 01/15/2017 0800 Gross per 24 hour  Intake 300 ml  Output 105 ml  Net 195 ml     BLOOD ADMINISTERED:none   LOCAL MEDICATIONS USED:  LIDOCAINE   SPECIMEN:  Source of Specimen:  Endometrial curetting, endocervical curetting, endocervical polyp  DISPOSITION OF SPECIMEN:  PATHOLOGY  COUNTS:  YES  PLAN OF CARE: Transfer to PACU  Marie-Lyne LavoieMD8:03 AM

## 2017-01-20 ENCOUNTER — Encounter (HOSPITAL_BASED_OUTPATIENT_CLINIC_OR_DEPARTMENT_OTHER): Payer: Self-pay | Admitting: Obstetrics & Gynecology

## 2017-01-27 NOTE — Anesthesia Postprocedure Evaluation (Signed)
Anesthesia Post Note  Patient: Tabitha Mcconnell  Procedure(s) Performed: HYSTEROSCOPY WITH NOVASURE (N/A Vagina ) DILATATION AND CURETTAGE /HYSTEROSCOPY (N/A Vagina )     Patient location during evaluation: PACU Anesthesia Type: General Level of consciousness: awake and alert Pain management: pain level controlled Vital Signs Assessment: post-procedure vital signs reviewed and stable Respiratory status: spontaneous breathing, nonlabored ventilation, respiratory function stable and patient connected to nasal cannula oxygen Cardiovascular status: blood pressure returned to baseline and stable Postop Assessment: no apparent nausea or vomiting Anesthetic complications: no    Last Vitals:  Vitals:   01/15/17 0915 01/15/17 1002  BP: (!) 144/102 (!) 148/106  Pulse: 78 86  Resp: 16 16  Temp:  36.8 C  SpO2: 93% 98%    Last Pain:  Vitals:   01/15/17 0543  TempSrc: Oral                 Marsha Hillman

## 2017-02-05 ENCOUNTER — Ambulatory Visit (INDEPENDENT_AMBULATORY_CARE_PROVIDER_SITE_OTHER): Payer: 59 | Admitting: Obstetrics & Gynecology

## 2017-02-05 ENCOUNTER — Encounter: Payer: Self-pay | Admitting: Obstetrics & Gynecology

## 2017-02-05 VITALS — BP 138/86

## 2017-02-05 DIAGNOSIS — Z09 Encounter for follow-up examination after completed treatment for conditions other than malignant neoplasm: Secondary | ICD-10-CM

## 2017-02-05 NOTE — Patient Instructions (Signed)
1. Follow-up examination after gynecological surgery Good postop evolution with no complication.  Pathology results benign, reviewed.  Patient reassured.  Will follow up for annual gynecologic exam in March 2019 with Elon Alas.  Abigail Butts, good seeing you today!

## 2017-02-05 NOTE — Progress Notes (Signed)
    Tabitha Mcconnell 06/30/1975 [id number]        42 y.o.  G1P1   RP:  Postop HSC/Removal of Cervical Polyp/D+C/Novasure endometrial ablation 01/15/2017  HPI: Good postop evolution.  No abnormal vaginal discharge.  No vaginal bleeding.  No pelvic pain.  No fever.  Urine and bowel movements normal currently.  Patho:  Endocervix, curettage, and polyp - BENIGN ENDOCERVICAL POLYP - BENIGN SQUAMOUS EPITHELIUM - PROLIFERATIVE ENDOMETRIUM - NO DYSPLASIA OR MALIGNANCY IDENTIFIED  Past medical history,surgical history, problem list, medications, allergies, family history and social history were all reviewed and documented in the EPIC chart.  Directed ROS with pertinent positives and negatives documented in the history of present illness/assessment and plan.  Exam:  Vitals:   02/05/17 1107  BP: 138/86   General appearance:  Normal  Gyn exam: Vulva normal.  Bimanual exam:  Uterus AV, normal volume, NT, mobile.  Normal secretions.   Assessment/Plan:  42 y.o. G1P1   1. Follow-up examination after gynecological surgery Good postop evolution with no complication.  Pathology results benign, reviewed.  Patient reassured.  Will follow up for annual gynecologic exam in March 2019 with Elon Alas.  Princess Bruins MD, 11:33 AM 02/05/2017

## 2017-03-04 ENCOUNTER — Other Ambulatory Visit: Payer: Self-pay | Admitting: Women's Health

## 2017-03-04 DIAGNOSIS — Z1231 Encounter for screening mammogram for malignant neoplasm of breast: Secondary | ICD-10-CM

## 2017-03-23 ENCOUNTER — Ambulatory Visit: Payer: 59

## 2017-04-05 ENCOUNTER — Ambulatory Visit: Payer: 59

## 2017-04-05 ENCOUNTER — Ambulatory Visit
Admission: RE | Admit: 2017-04-05 | Discharge: 2017-04-05 | Disposition: A | Discharge status: Home / Self Care | Payer: 59 | Source: Ambulatory Visit | Attending: Women's Health | Admitting: Women's Health

## 2017-04-05 DIAGNOSIS — Z1231 Encounter for screening mammogram for malignant neoplasm of breast: Secondary | ICD-10-CM

## 2017-04-06 ENCOUNTER — Encounter: Payer: Self-pay | Admitting: Women's Health

## 2017-04-12 ENCOUNTER — Ambulatory Visit (INDEPENDENT_AMBULATORY_CARE_PROVIDER_SITE_OTHER): Payer: 59 | Admitting: Women's Health

## 2017-04-12 ENCOUNTER — Encounter: Payer: Self-pay | Admitting: Women's Health

## 2017-04-12 VITALS — BP 132/80 | Ht 61.0 in | Wt 221.0 lb

## 2017-04-12 DIAGNOSIS — Z13220 Encounter for screening for lipoid disorders: Secondary | ICD-10-CM

## 2017-04-12 DIAGNOSIS — Z23 Encounter for immunization: Secondary | ICD-10-CM

## 2017-04-12 DIAGNOSIS — Z01419 Encounter for gynecological examination (general) (routine) without abnormal findings: Secondary | ICD-10-CM

## 2017-04-12 MED ORDER — ESCITALOPRAM OXALATE 10 MG PO TABS
10.0000 mg | ORAL_TABLET | Freq: Every day | ORAL | 2 refills | Status: DC
Start: 2017-04-12 — End: 2017-11-21

## 2017-04-12 NOTE — Patient Instructions (Signed)

## 2017-04-12 NOTE — Progress Notes (Signed)
TEONDRA NEWBURG [birth year] [id number]    History:    Presents for annual exam. Amenorrheic since ablation for menorrhagia 12/2016.  Using condoms inconsistently. Normal Pap and mammogram history. Reported that her 43 y.o son does not live at home anymore which has casused stress/depression. Seeing counselor, tried  Zoloft stopped, made  her sleepy. Smokes socially.  Past medical history, past surgical history, family history and social history were all reviewed and documented in the EPIC chart. Works at [employer], Network engineer job. Son [name]) 73 y.o.   ROS:  A ROS was performed and pertinent positives and negatives are included.  Exam:  Vitals:   04/12/17 1548  BP: 132/80  Weight: 221 lb (100.2 kg)  Height: 5' 1"  (1.549 m)   Body mass index is 41.76 kg/m.   General appearance:  Normal Thyroid:  Symmetrical, normal in size, without palpable masses or nodularity. Respiratory  Auscultation:  Clear without wheezing or rhonchi Cardiovascular  Auscultation:  Regular rate, without rubs, murmurs or gallops  Edema/varicosities:  Not grossly evident Abdominal  Soft,nontender, without masses, guarding or rebound.  Liver/spleen:  No organomegaly noted  Hernia:  None appreciated  Skin  Inspection:  Grossly normal   Breasts: Examined lying and sitting.     Right: Without masses, retractions, discharge or axillary adenopathy.     Left: Without masses, retractions, discharge or axillary adenopathy. Gentitourinary   Inguinal/mons:  Normal without inguinal adenopathy  External genitalia:  Normal  BUS/Urethra/Skene's glands:  Normal  Vagina:  Normal  Cervix:  Normal  Uterus:  normal in size, shape and contour.  Midline and mobile  Adnexa/parametria:     Rt: Without masses or tenderness.   Lt: Without masses or tenderness.  Anus and perineum: Normal  Digital rectal exam: Normal sphincter tone without palpated masses or tenderness  Assessment/Plan:  42 y.o.  SBF G1P1  for annual exam with no  complaint.   Amenorrhea since ablation 12/2016 Obesity Situational stress/depression Social smoker  Plan:  Options reviewed, will try Lexapro 10 mg half tablet for 2 weeks then one tablet daily.continue counseling, reviewed importance of regular exercise, leisure activities and self-care. Contraception options reviewed, agreed on condoms.  SBE's,  continue annual screening mammogram, calcium rich diet, vitamin D 1000 daily encouraged. Reviewed importance of regular exercise and diet for weight loss.  Reviewed importance of no smoking. Will return to office for fasting labs, CBC, lipid panel, CMP. Pap ASCUS with negative HR HPV 2018, new screening guidelines reviewed.    Huel Cote [facility], 4:26 PM 04/12/2017

## 2017-04-13 ENCOUNTER — Other Ambulatory Visit: Payer: 59

## 2017-04-13 DIAGNOSIS — Z23 Encounter for immunization: Secondary | ICD-10-CM | POA: Diagnosis not present

## 2017-04-13 DIAGNOSIS — Z13220 Encounter for screening for lipoid disorders: Secondary | ICD-10-CM

## 2017-04-13 DIAGNOSIS — Z01419 Encounter for gynecological examination (general) (routine) without abnormal findings: Secondary | ICD-10-CM

## 2017-04-13 NOTE — Addendum Note (Signed)
Addended by: Lorine Bears on: 04/13/2017 08:56 AM   Modules accepted: Orders

## 2017-04-15 LAB — CBC WITH DIFFERENTIAL/PLATELET
BASOS ABS: 43 {cells}/uL (ref 0–200)
Basophils Relative: 0.6 %
Eosinophils Absolute: 130 cells/uL (ref 15–500)
Eosinophils Relative: 1.8 %
HEMATOCRIT: 41.2 % (ref 35.0–45.0)
Hemoglobin: 14.2 g/dL (ref 11.7–15.5)
LYMPHS ABS: 1987 {cells}/uL (ref 850–3900)
MCH: 29.8 pg (ref 27.0–33.0)
MCHC: 34.5 g/dL (ref 32.0–36.0)
MCV: 86.4 fL (ref 80.0–100.0)
MPV: 11.4 fL (ref 7.5–12.5)
Monocytes Relative: 9.9 %
NEUTROS PCT: 60.1 %
Neutro Abs: 4327 cells/uL (ref 1500–7800)
Platelets: 285 10*3/uL (ref 140–400)
RBC: 4.77 10*6/uL (ref 3.80–5.10)
RDW: 12.5 % (ref 11.0–15.0)
TOTAL LYMPHOCYTE: 27.6 %
WBC: 7.2 10*3/uL (ref 3.8–10.8)
WBCMIX: 713 {cells}/uL (ref 200–950)

## 2017-04-15 LAB — COMPREHENSIVE METABOLIC PANEL
AG Ratio: 1.4 (calc) (ref 1.0–2.5)
ALBUMIN MSPROF: 3.8 g/dL (ref 3.6–5.1)
ALT: 20 U/L (ref 6–29)
AST: 18 U/L (ref 10–30)
Alkaline phosphatase (APISO): 110 U/L (ref 33–115)
BILIRUBIN TOTAL: 0.5 mg/dL (ref 0.2–1.2)
BUN: 7 mg/dL (ref 7–25)
CALCIUM: 9.1 mg/dL (ref 8.6–10.2)
CHLORIDE: 105 mmol/L (ref 98–110)
CO2: 27 mmol/L (ref 20–32)
CREATININE: 0.76 mg/dL (ref 0.50–1.10)
Globulin: 2.7 g/dL (calc) (ref 1.9–3.7)
Glucose, Bld: 124 mg/dL — ABNORMAL HIGH (ref 65–99)
POTASSIUM: 4.1 mmol/L (ref 3.5–5.3)
SODIUM: 136 mmol/L (ref 135–146)
TOTAL PROTEIN: 6.5 g/dL (ref 6.1–8.1)

## 2017-04-15 LAB — LIPID PANEL
CHOL/HDL RATIO: 3.2 (calc) (ref <5.0)
CHOLESTEROL: 136 mg/dL (ref <200)
HDL: 42 mg/dL — ABNORMAL LOW (ref >50)
LDL Cholesterol (Calc): 76 mg/dL (calc)
Non-HDL Cholesterol (Calc): 94 mg/dL (calc) (ref <130)
Triglycerides: 95 mg/dL (ref <150)

## 2017-04-15 LAB — HEMOGLOBIN A1C W/OUT EAG: Hgb A1c MFr Bld: 5.8 % of total Hgb — ABNORMAL HIGH (ref <5.7)

## 2017-04-15 LAB — TEST AUTHORIZATION

## 2017-04-16 ENCOUNTER — Other Ambulatory Visit: Payer: 59

## 2017-11-21 ENCOUNTER — Other Ambulatory Visit: Payer: Self-pay | Admitting: Women's Health

## 2018-04-14 ENCOUNTER — Other Ambulatory Visit: Payer: Self-pay

## 2018-04-18 ENCOUNTER — Ambulatory Visit (INDEPENDENT_AMBULATORY_CARE_PROVIDER_SITE_OTHER): Payer: 59 | Admitting: Women's Health

## 2018-04-18 ENCOUNTER — Other Ambulatory Visit: Payer: Self-pay

## 2018-04-18 ENCOUNTER — Encounter: Payer: Self-pay | Admitting: Women's Health

## 2018-04-18 VITALS — BP 128/80 | Ht 61.0 in | Wt 207.0 lb

## 2018-04-18 DIAGNOSIS — Z01419 Encounter for gynecological examination (general) (routine) without abnormal findings: Secondary | ICD-10-CM

## 2018-04-18 DIAGNOSIS — Z13220 Encounter for screening for lipoid disorders: Secondary | ICD-10-CM

## 2018-04-18 NOTE — Progress Notes (Signed)
Tabitha Mcconnell 1975-08-21 [id number]    History:    Presents for annual exam.  2018 benign polyp, endometrial ablation /amenorrheic per Dr. Dellis Filbert.  Normal Pap and mammogram history, mammogram due.  Is seeing a counselor for situational stressors with her 44 year old son.  Has not been on any medication declines.  Same partner, condoms, denies need for STD screen.  Past medical history, past surgical history, family history and social history were all reviewed and documented in the EPIC chart.  Works at [employer] [place].  Tabitha Mcconnell 17 unmotivated.  ROS:  A ROS was performed and pertinent positives and negatives are included.  Exam:  Vitals:   04/18/18 1403  BP: 128/80  Weight: 207 lb (93.9 kg)  Height: 5' 1"  (1.549 m)   Body mass index is 39.11 kg/m.   General appearance:  Normal Thyroid:  Symmetrical, normal in size, without palpable masses or nodularity. Respiratory  Auscultation:  Clear without wheezing or rhonchi Cardiovascular  Auscultation:  Regular rate, without rubs, murmurs or gallops  Edema/varicosities:  Not grossly evident Abdominal  Soft,nontender, without masses, guarding or rebound.  Liver/spleen:  No organomegaly noted  Hernia:  None appreciated  Skin  Inspection:  Grossly normal   Breasts: Examined lying and sitting.     Right: Without masses, retractions, discharge or axillary adenopathy.     Left: Without masses, retractions, discharge or axillary adenopathy. Gentitourinary   Inguinal/mons:  Normal without inguinal adenopathy  External genitalia:  Normal  BUS/Urethra/Skene's glands:  Normal  Vagina:  Normal  Cervix:  Normal  Uterus:  normal in size, shape and contour.  Midline and mobile  Adnexa/parametria:     Rt: Without masses or tenderness.   Lt: Without masses or tenderness.  Anus and perineum: Normal  Digital rectal exam: Normal sphincter tone without palpated masses or tenderness  Assessment/Plan:  43 y.o. SBF G1, P1 for annual exam with no  complaints.    2018 endometrial ablation amenorrheic Anxiety/depression seeing counselor Obesity  Plan: SBEs, annual screening mammogram, exercise, calcium rich foods, vitamin D 1000 daily and decrease calorie/carbs encouraged.  Reviewed importance of self-care, leisure activities.  Continue counseling.  CBC, CMP, lipid panel, Pap normal 2018, new screening guidelines reviewed.    Tabitha Mcconnell, 2:59 PM 04/18/2018

## 2018-04-18 NOTE — Patient Instructions (Addendum)
Vit D 1000 iu  Health Maintenance, Female Adopting a healthy lifestyle and getting preventive care can go a long way to promote health and wellness. Talk with your health care provider about what schedule of regular examinations is right for you. This is a good chance for you to check in with your provider about disease prevention and staying healthy. In between checkups, there are plenty of things you can do on your own. Experts have done a lot of research about which lifestyle changes and preventive measures are most likely to keep you healthy. Ask your health care provider for more information. Weight and diet Eat a healthy diet  Be sure to include plenty of vegetables, fruits, low-fat dairy products, and lean protein.  Do not eat a lot of foods high in solid fats, added sugars, or salt.  Get regular exercise. This is one of the most important things you can do for your health. ? Most adults should exercise for at least 150 minutes each week. The exercise should increase your heart rate and make you sweat (moderate-intensity exercise). ? Most adults should also do strengthening exercises at least twice a week. This is in addition to the moderate-intensity exercise. Maintain a healthy weight  Body mass index (BMI) is a measurement that can be used to identify possible weight problems. It estimates body fat based on height and weight. Your health care provider can help determine your BMI and help you achieve or maintain a healthy weight.  For females 60 years of age and older: ? A BMI below 18.5 is considered underweight. ? A BMI of 18.5 to 24.9 is normal. ? A BMI of 25 to 29.9 is considered overweight. ? A BMI of 30 and above is considered obese. Watch levels of cholesterol and blood lipids  You should start having your blood tested for lipids and cholesterol at 44 years of age, then have this test every 5 years.  You may need to have your cholesterol levels checked more often  if: ? Your lipid or cholesterol levels are high. ? You are older than 44 years of age. ? You are at high risk for heart disease. Cancer screening Lung Cancer  Lung cancer screening is recommended for adults 70-63 years old who are at high risk for lung cancer because of a history of smoking.  A yearly low-dose CT scan of the lungs is recommended for people who: ? Currently smoke. ? Have quit within the past 15 years. ? Have at least a 30-pack-year history of smoking. A pack year is smoking an average of one pack of cigarettes a day for 1 year.  Yearly screening should continue until it has been 15 years since you quit.  Yearly screening should stop if you develop a health problem that would prevent you from having lung cancer treatment. Breast Cancer  Practice breast self-awareness. This means understanding how your breasts normally appear and feel.  It also means doing regular breast self-exams. Let your health care provider know about any changes, no matter how small.  If you are in your 20s or 30s, you should have a clinical breast exam (CBE) by a health care provider every 1-3 years as part of a regular health exam.  If you are 22 or older, have a CBE every year. Also consider having a breast X-ray (mammogram) every year.  If you have a family history of breast cancer, talk to your health care provider about genetic screening.  If you are at high  risk for breast cancer, talk to your health care provider about having an MRI and a mammogram every year.  Breast cancer gene (BRCA) assessment is recommended for women who have family members with BRCA-related cancers. BRCA-related cancers include: ? Breast. ? Ovarian. ? Tubal. ? Peritoneal cancers.  Results of the assessment will determine the need for genetic counseling and BRCA1 and BRCA2 testing. Cervical Cancer Your health care provider may recommend that you be screened regularly for cancer of the pelvic organs (ovaries,  uterus, and vagina). This screening involves a pelvic examination, including checking for microscopic changes to the surface of your cervix (Pap test). You may be encouraged to have this screening done every 3 years, beginning at age 75.  For women ages 50-65, health care providers may recommend pelvic exams and Pap testing every 3 years, or they may recommend the Pap and pelvic exam, combined with testing for human papilloma virus (HPV), every 5 years. Some types of HPV increase your risk of cervical cancer. Testing for HPV may also be done on women of any age with unclear Pap test results.  Other health care providers may not recommend any screening for nonpregnant women who are considered low risk for pelvic cancer and who do not have symptoms. Ask your health care provider if a screening pelvic exam is right for you.  If you have had past treatment for cervical cancer or a condition that could lead to cancer, you need Pap tests and screening for cancer for at least 20 years after your treatment. If Pap tests have been discontinued, your risk factors (such as having a new sexual partner) need to be reassessed to determine if screening should resume. Some women have medical problems that increase the chance of getting cervical cancer. In these cases, your health care provider may recommend more frequent screening and Pap tests. Colorectal Cancer  This type of cancer can be detected and often prevented.  Routine colorectal cancer screening usually begins at 44 years of age and continues through 44 years of age.  Your health care provider may recommend screening at an earlier age if you have risk factors for colon cancer.  Your health care provider may also recommend using home test kits to check for hidden blood in the stool.  A small camera at the end of a tube can be used to examine your colon directly (sigmoidoscopy or colonoscopy). This is done to check for the earliest forms of colorectal  cancer.  Routine screening usually begins at age 17.  Direct examination of the colon should be repeated every 5-10 years through 44 years of age. However, you may need to be screened more often if early forms of precancerous polyps or small growths are found. Skin Cancer  Check your skin from head to toe regularly.  Tell your health care provider about any new moles or changes in moles, especially if there is a change in a mole's shape or color.  Also tell your health care provider if you have a mole that is larger than the size of a pencil eraser.  Always use sunscreen. Apply sunscreen liberally and repeatedly throughout the day.  Protect yourself by wearing long sleeves, pants, a wide-brimmed hat, and sunglasses whenever you are outside. Heart disease, diabetes, and high blood pressure  High blood pressure causes heart disease and increases the risk of stroke. High blood pressure is more likely to develop in: ? People who have blood pressure in the high end of the normal range (  130-139/85-89 mm Hg). ? People who are overweight or obese. ? People who are African American.  If you are 110-28 years of age, have your blood pressure checked every 3-5 years. If you are 42 years of age or older, have your blood pressure checked every year. You should have your blood pressure measured twice-once when you are at a hospital or clinic, and once when you are not at a hospital or clinic. Record the average of the two measurements. To check your blood pressure when you are not at a hospital or clinic, you can use: ? An automated blood pressure machine at a pharmacy. ? A home blood pressure monitor.  If you are between 28 years and 6 years old, ask your health care provider if you should take aspirin to prevent strokes.  Have regular diabetes screenings. This involves taking a blood sample to check your fasting blood sugar level. ? If you are at a normal weight and have a low risk for diabetes,  have this test once every three years after 44 years of age. ? If you are overweight and have a high risk for diabetes, consider being tested at a younger age or more often. Preventing infection Hepatitis B  If you have a higher risk for hepatitis B, you should be screened for this virus. You are considered at high risk for hepatitis B if: ? You were born in a country where hepatitis B is common. Ask your health care provider which countries are considered high risk. ? Your parents were born in a high-risk country, and you have not been immunized against hepatitis B (hepatitis B vaccine). ? You have HIV or AIDS. ? You use needles to inject street drugs. ? You live with someone who has hepatitis B. ? You have had sex with someone who has hepatitis B. ? You get hemodialysis treatment. ? You take certain medicines for conditions, including cancer, organ transplantation, and autoimmune conditions. Hepatitis C  Blood testing is recommended for: ? Everyone born from [birth year] through 1966. ? Anyone with known risk factors for hepatitis C. Sexually transmitted infections (STIs)  You should be screened for sexually transmitted infections (STIs) including gonorrhea and chlamydia if: ? You are sexually active and are younger than 44 years of age. ? You are older than 44 years of age and your health care provider tells you that you are at risk for this type of infection. ? Your sexual activity has changed since you were last screened and you are at an increased risk for chlamydia or gonorrhea. Ask your health care provider if you are at risk.  If you do not have HIV, but are at risk, it may be recommended that you take a prescription medicine daily to prevent HIV infection. This is called pre-exposure prophylaxis (PrEP). You are considered at risk if: ? You are sexually active and do not regularly use condoms or know the HIV status of your partner(s). ? You take drugs by injection. ? You are sexually  active with a partner who has HIV. Talk with your health care provider about whether you are at high risk of being infected with HIV. If you choose to begin PrEP, you should first be tested for HIV. You should then be tested every 3 months for as long as you are taking PrEP. Pregnancy  If you are premenopausal and you may become pregnant, ask your health care provider about preconception counseling.  If you may become pregnant, take 400 to 800 micrograms (  mcg) of folic acid every day.  If you want to prevent pregnancy, talk to your health care provider about birth control (contraception). Osteoporosis and menopause  Osteoporosis is a disease in which the bones lose minerals and strength with aging. This can result in serious bone fractures. Your risk for osteoporosis can be identified using a bone density scan.  If you are 44 years of age or older, or if you are at risk for osteoporosis and fractures, ask your health care provider if you should be screened.  Ask your health care provider whether you should take a calcium or vitamin D supplement to lower your risk for osteoporosis.  Menopause may have certain physical symptoms and risks.  Hormone replacement therapy may reduce some of these symptoms and risks. Talk to your health care provider about whether hormone replacement therapy is right for you. Follow these instructions at home:  Schedule regular health, dental, and eye exams.  Stay current with your immunizations.  Do not use any tobacco products including cigarettes, chewing tobacco, or electronic cigarettes.  If you are pregnant, do not drink alcohol.  If you are breastfeeding, limit how much and how often you drink alcohol.  Limit alcohol intake to no more than 1 drink per day for nonpregnant women. One drink equals 12 ounces of beer, 5 ounces of wine, or 1 ounces of hard liquor.  Do not use street drugs.  Do not share needles.  Ask your health care provider for  help if you need support or information about quitting drugs.  Tell your health care provider if you often feel depressed.  Tell your health care provider if you have ever been abused or do not feel safe at home. This information is not intended to replace advice given to you by your health care provider. Make sure you discuss any questions you have with your health care provider. Document Released: 07/28/2010 Document Revised: 06/20/2015 Document Reviewed: 10/16/2014 Elsevier Interactive Patient Education  2019 [employer].

## 2018-04-19 LAB — LIPID PANEL
CHOL/HDL RATIO: 3.9 (calc) (ref <5.0)
CHOLESTEROL: 177 mg/dL (ref <200)
HDL: 45 mg/dL — AB (ref >=50)
LDL Cholesterol (Calc): 111 mg/dL (calc) — ABNORMAL HIGH
Non-HDL Cholesterol (Calc): 132 mg/dL (calc) — ABNORMAL HIGH (ref <130)
Triglycerides: 104 mg/dL (ref <150)

## 2018-04-19 LAB — URINALYSIS, COMPLETE W/RFL CULTURE
BILIRUBIN URINE: NEGATIVE
Bacteria, UA: NONE SEEN /HPF
GLUCOSE, UA: NEGATIVE
HGB URINE DIPSTICK: NEGATIVE
Hyaline Cast: NONE SEEN /LPF
Ketones, ur: NEGATIVE
Leukocyte Esterase: NEGATIVE
NITRITES URINE, INITIAL: NEGATIVE
Protein, ur: NEGATIVE
RBC / HPF: NONE SEEN /HPF (ref 0–2)
Specific Gravity, Urine: 1.004 (ref 1.001–1.03)
WBC UA: NONE SEEN /HPF (ref 0–5)
pH: 7 (ref 5.0–8.0)

## 2018-04-19 LAB — CBC WITH DIFFERENTIAL/PLATELET
Absolute Monocytes: 598 cells/uL (ref 200–950)
BASOS PCT: 0.6 %
Basophils Absolute: 39 cells/uL (ref 0–200)
EOS PCT: 0.5 %
Eosinophils Absolute: 33 cells/uL (ref 15–500)
HCT: 43.1 % (ref 35.0–45.0)
HEMOGLOBIN: 15.2 g/dL (ref 11.7–15.5)
LYMPHS ABS: 2548 {cells}/uL (ref 850–3900)
MCH: 31.2 pg (ref 27.0–33.0)
MCHC: 35.3 g/dL (ref 32.0–36.0)
MCV: 88.5 fL (ref 80.0–100.0)
MONOS PCT: 9.2 %
MPV: 11.5 fL (ref 7.5–12.5)
NEUTROS ABS: 3283 {cells}/uL (ref 1500–7800)
Neutrophils Relative %: 50.5 %
Platelets: 284 10*3/uL (ref 140–400)
RBC: 4.87 10*6/uL (ref 3.80–5.10)
RDW: 12.6 % (ref 11.0–15.0)
Total Lymphocyte: 39.2 %
WBC: 6.5 10*3/uL (ref 3.8–10.8)

## 2018-04-19 LAB — COMPREHENSIVE METABOLIC PANEL
AG Ratio: 1.4 (calc) (ref 1.0–2.5)
ALKALINE PHOSPHATASE (APISO): 109 U/L (ref 31–125)
ALT: 28 U/L (ref 6–29)
AST: 29 U/L (ref 10–30)
Albumin: 4.3 g/dL (ref 3.6–5.1)
BUN/Creatinine Ratio: 8 (calc) (ref 6–22)
BUN: 6 mg/dL — ABNORMAL LOW (ref 7–25)
CALCIUM: 10 mg/dL (ref 8.6–10.2)
CHLORIDE: 103 mmol/L (ref 98–110)
CO2: 26 mmol/L (ref 20–32)
Creat: 0.79 mg/dL (ref 0.50–1.10)
GLOBULIN: 3.1 g/dL (ref 1.9–3.7)
Glucose, Bld: 87 mg/dL (ref 65–99)
Potassium: 3.8 mmol/L (ref 3.5–5.3)
Sodium: 139 mmol/L (ref 135–146)
Total Bilirubin: 0.3 mg/dL (ref 0.2–1.2)
Total Protein: 7.4 g/dL (ref 6.1–8.1)

## 2018-04-19 LAB — NO CULTURE INDICATED

## 2018-05-03 NOTE — Telephone Encounter (Signed)
My chart message came back un-read patient informed with message.

## 2018-10-18 ENCOUNTER — Encounter: Payer: Self-pay | Admitting: Gynecology

## 2019-02-27 DIAGNOSIS — R0683 Snoring: Secondary | ICD-10-CM | POA: Insufficient documentation

## 2019-04-18 ENCOUNTER — Other Ambulatory Visit: Payer: Self-pay

## 2019-04-19 ENCOUNTER — Encounter: Payer: Self-pay | Admitting: Women's Health

## 2019-04-19 ENCOUNTER — Ambulatory Visit (INDEPENDENT_AMBULATORY_CARE_PROVIDER_SITE_OTHER): Payer: 59 | Admitting: Women's Health

## 2019-04-19 VITALS — BP 110/78 | Ht 61.0 in | Wt 207.0 lb

## 2019-04-19 DIAGNOSIS — Z13220 Encounter for screening for lipoid disorders: Secondary | ICD-10-CM | POA: Diagnosis not present

## 2019-04-19 DIAGNOSIS — Z01419 Encounter for gynecological examination (general) (routine) without abnormal findings: Secondary | ICD-10-CM

## 2019-04-19 NOTE — Progress Notes (Signed)
Tabitha Mcconnell 1975/11/01 [id number]    History:    Presents for annual exam.  2018 benign endometrial polyp ablation amenorrheic since condoms for contraception.  Same partner with negative STD screen.  Normal Pap and mammogram history.  Normal labs.  Past medical history, past surgical history, family history and social history were all reviewed and documented in the EPIC chart.  Works for [employer].  Son Tabitha Mcconnell 75 living with his father and working in his business.  2013 cholecystectomy.  ROS:  A ROS was performed and pertinent positives and negatives are included.  Exam:  Vitals:   04/19/19 1554  BP: 110/78  Weight: 207 lb (93.9 kg)  Height: 5' 1"  (1.549 m)   Body mass index is 39.11 kg/m.   General appearance:  Normal Thyroid:  Symmetrical, normal in size, without palpable masses or nodularity. Respiratory  Auscultation:  Clear without wheezing or rhonchi Cardiovascular  Auscultation:  Regular rate, without rubs, murmurs or gallops  Edema/varicosities:  Not grossly evident Abdominal  Soft,nontender, without masses, guarding or rebound.  Liver/spleen:  No organomegaly noted  Hernia:  None appreciated  Skin  Inspection:  Grossly normal   Breasts: Examined lying and sitting.     Right: Without masses, retractions, discharge or axillary adenopathy.     Left: Without masses, retractions, discharge or axillary adenopathy. Gentitourinary   Inguinal/mons:  Normal without inguinal adenopathy  External genitalia:  Normal  BUS/Urethra/Skene's glands:  Normal  Vagina:  Normal  Cervix:  Normal  Uterus:  normal in size, shape and contour.  Midline and mobile  Adnexa/parametria:     Rt: Without masses or tenderness.   Lt: Without masses or tenderness.  Anus and perineum: Normal  Digital rectal exam: Normal sphincter tone without palpated masses or tenderness  Assessment/Plan:  44 y.o. S BF G1 P1 for annual exam with no complaints of vaginal discharge, urinary symptoms,  abdominal pain or dyspareunia.  2018 benign endometrial polyp ablation amenorrheic since/condoms Obesity  Plan: SBEs, overdue for mammogram breast center information given instructed to schedule.  Encouraged increase exercise and decrease calories is maintaining weight.  Calcium rich foods, vitamin D 1000 IUs daily encouraged.  Continue condoms for contraception, currently having no menopausal symptoms.  CBC, CMP, lipid panel, Pap with HR HPV typing, normal Pap history.    [place], 4:30 PM 04/19/2019

## 2019-04-19 NOTE — Progress Notes (Signed)
[id number]

## 2019-04-19 NOTE — Patient Instructions (Signed)
Breast center [phone]  Mammogram Vit D 1000 iu  Take time for fun! Health Maintenance, Female Adopting a healthy lifestyle and getting preventive care are important in promoting health and wellness. Ask your health care provider about:  The right schedule for you to have regular tests and exams.  Things you can do on your own to prevent diseases and keep yourself healthy. What should I know about diet, weight, and exercise? Eat a healthy diet   Eat a diet that includes plenty of vegetables, fruits, low-fat dairy products, and lean protein.  Do not eat a lot of foods that are high in solid fats, added sugars, or sodium. Maintain a healthy weight Body mass index (BMI) is used to identify weight problems. It estimates body fat based on height and weight. Your health care provider can help determine your BMI and help you achieve or maintain a healthy weight. Get regular exercise Get regular exercise. This is one of the most important things you can do for your health. Most adults should:  Exercise for at least 150 minutes each week. The exercise should increase your heart rate and make you sweat (moderate-intensity exercise).  Do strengthening exercises at least twice a week. This is in addition to the moderate-intensity exercise.  Spend less time sitting. Even light physical activity can be beneficial. Watch cholesterol and blood lipids Have your blood tested for lipids and cholesterol at 45 years of age, then have this test every 5 years. Have your cholesterol levels checked more often if:  Your lipid or cholesterol levels are high.  You are older than 45 years of age.  You are at high risk for heart disease. What should I know about cancer screening? Depending on your health history and family history, you may need to have cancer screening at various ages. This may include screening for:  Breast cancer.  Cervical cancer.  Colorectal cancer.  Skin cancer.  Lung  cancer. What should I know about heart disease, diabetes, and high blood pressure? Blood pressure and heart disease  High blood pressure causes heart disease and increases the risk of stroke. This is more likely to develop in people who have high blood pressure readings, are of African descent, or are overweight.  Have your blood pressure checked: ? Every 3-5 years if you are 20-33 years of age. ? Every year if you are 45 years old or older. Diabetes Have regular diabetes screenings. This checks your fasting blood sugar level. Have the screening done:  Once every three years after age 45 if you are at a normal weight and have a low risk for diabetes.  More often and at a younger age if you are overweight or have a high risk for diabetes. What should I know about preventing infection? Hepatitis B If you have a higher risk for hepatitis B, you should be screened for this virus. Talk with your health care provider to find out if you are at risk for hepatitis B infection. Hepatitis C Testing is recommended for:  Everyone born from [birth year] through 1966.  Anyone with known risk factors for hepatitis C. Sexually transmitted infections (STIs)  Get screened for STIs, including gonorrhea and chlamydia, if: ? You are sexually active and are younger than 45 years of age. ? You are older than 45 years of age and your health care provider tells you that you are at risk for this type of infection. ? Your sexual activity has changed since you were last screened, and you  are at increased risk for chlamydia or gonorrhea. Ask your health care provider if you are at risk.  Ask your health care provider about whether you are at high risk for HIV. Your health care provider may recommend a prescription medicine to help prevent HIV infection. If you choose to take medicine to prevent HIV, you should first get tested for HIV. You should then be tested every 3 months for as long as you are taking the  medicine. Pregnancy  If you are about to stop having your period (premenopausal) and you may become pregnant, seek counseling before you get pregnant.  Take 400 to 800 micrograms (mcg) of folic acid every day if you become pregnant.  Ask for birth control (contraception) if you want to prevent pregnancy. Osteoporosis and menopause Osteoporosis is a disease in which the bones lose minerals and strength with aging. This can result in bone fractures. If you are 45 years old or older, or if you are at risk for osteoporosis and fractures, ask your health care provider if you should:  Be screened for bone loss.  Take a calcium or vitamin D supplement to lower your risk of fractures.  Be given hormone replacement therapy (HRT) to treat symptoms of menopause. Follow these instructions at home: Lifestyle  Do not use any products that contain nicotine or tobacco, such as cigarettes, e-cigarettes, and chewing tobacco. If you need help quitting, ask your health care provider.  Do not use street drugs.  Do not share needles.  Ask your health care provider for help if you need support or information about quitting drugs. Alcohol use  Do not drink alcohol if: ? Your health care provider tells you not to drink. ? You are pregnant, may be pregnant, or are planning to become pregnant.  If you drink alcohol: ? Limit how much you use to 0-1 drink a day. ? Limit intake if you are breastfeeding.  Be aware of how much alcohol is in your drink. In the [place] one drink equals one 12 oz bottle of beer (355 mL), one 5 oz glass of wine (148 mL), or one 1 oz glass of hard liquor (44 mL). General instructions  Schedule regular health, dental, and eye exams.  Stay current with your vaccines.  Tell your health care provider if: ? You often feel depressed. ? You have ever been abused or do not feel safe at home. Summary  Adopting a healthy lifestyle and getting preventive care are important in  promoting health and wellness.  Follow your health care provider's instructions about healthy diet, exercising, and getting tested or screened for diseases.  Follow your health care provider's instructions on monitoring your cholesterol and blood pressure. This information is not intended to replace advice given to you by your health care provider. Make sure you discuss any questions you have with your health care provider. Document Revised: 01/05/2018 Document Reviewed: 01/05/2018 Elsevier Patient Education  2020 [employer].

## 2019-04-21 LAB — PAP, TP IMAGING W/ HPV RNA, RFLX HPV TYPE 16,18/45: HPV DNA High Risk: NOT DETECTED

## 2019-04-22 LAB — CBC WITH DIFFERENTIAL/PLATELET
Absolute Monocytes: 845 cells/uL (ref 200–950)
Basophils Absolute: 63 cells/uL (ref 0–200)
Basophils Relative: 0.8 %
Eosinophils Absolute: 79 cells/uL (ref 15–500)
Eosinophils Relative: 1 %
HCT: 42.7 % (ref 35.0–45.0)
Hemoglobin: 14.4 g/dL (ref 11.7–15.5)
Lymphs Abs: 2773 cells/uL (ref 850–3900)
MCH: 30.3 pg (ref 27.0–33.0)
MCHC: 33.7 g/dL (ref 32.0–36.0)
MCV: 89.7 fL (ref 80.0–100.0)
MPV: 11.6 fL (ref 7.5–12.5)
Monocytes Relative: 10.7 %
Neutro Abs: 4140 cells/uL (ref 1500–7800)
Neutrophils Relative %: 52.4 %
Platelets: 260 10*3/uL (ref 140–400)
RBC: 4.76 10*6/uL (ref 3.80–5.10)
RDW: 12.1 % (ref 11.0–15.0)
Total Lymphocyte: 35.1 %
WBC: 7.9 10*3/uL (ref 3.8–10.8)

## 2019-04-22 LAB — LIPID PANEL
Cholesterol: 153 mg/dL (ref <200)
HDL: 45 mg/dL — ABNORMAL LOW (ref >=50)
LDL Cholesterol (Calc): 85 mg/dL (calc)
Non-HDL Cholesterol (Calc): 108 mg/dL (calc) (ref <130)
Total CHOL/HDL Ratio: 3.4 (calc) (ref <5.0)
Triglycerides: 124 mg/dL (ref <150)

## 2019-04-22 LAB — COMPREHENSIVE METABOLIC PANEL
AG Ratio: 1.6 (calc) (ref 1.0–2.5)
ALT: 28 U/L (ref 6–29)
AST: 20 U/L (ref 10–30)
Albumin: 4.1 g/dL (ref 3.6–5.1)
Alkaline phosphatase (APISO): 111 U/L (ref 31–125)
BUN: 9 mg/dL (ref 7–25)
CO2: 28 mmol/L (ref 20–32)
Calcium: 9.6 mg/dL (ref 8.6–10.2)
Chloride: 102 mmol/L (ref 98–110)
Creat: 0.79 mg/dL (ref 0.50–1.10)
Globulin: 2.5 g/dL (calc) (ref 1.9–3.7)
Glucose, Bld: 143 mg/dL — ABNORMAL HIGH (ref 65–99)
Potassium: 4 mmol/L (ref 3.5–5.3)
Sodium: 136 mmol/L (ref 135–146)
Total Bilirubin: 0.4 mg/dL (ref 0.2–1.2)
Total Protein: 6.6 g/dL (ref 6.1–8.1)

## 2019-04-22 LAB — TEST AUTHORIZATION

## 2019-04-22 LAB — HEMOGLOBIN A1C W/OUT EAG: Hgb A1c MFr Bld: 5.6 % of total Hgb (ref <5.7)

## 2019-05-02 ENCOUNTER — Other Ambulatory Visit: Payer: Self-pay | Admitting: Women's Health

## 2019-05-02 DIAGNOSIS — Z1231 Encounter for screening mammogram for malignant neoplasm of breast: Secondary | ICD-10-CM

## 2019-05-03 ENCOUNTER — Ambulatory Visit: Payer: 59

## 2019-05-04 ENCOUNTER — Other Ambulatory Visit: Payer: Self-pay | Admitting: Women's Health

## 2019-05-04 DIAGNOSIS — Z1231 Encounter for screening mammogram for malignant neoplasm of breast: Secondary | ICD-10-CM

## 2019-05-05 ENCOUNTER — Other Ambulatory Visit: Payer: Self-pay

## 2019-05-05 ENCOUNTER — Ambulatory Visit
Admission: RE | Admit: 2019-05-05 | Discharge: 2019-05-05 | Disposition: A | Discharge status: Home / Self Care | Payer: 59 | Source: Ambulatory Visit

## 2019-05-05 DIAGNOSIS — Z1231 Encounter for screening mammogram for malignant neoplasm of breast: Secondary | ICD-10-CM

## 2019-09-05 ENCOUNTER — Encounter: Payer: Self-pay | Admitting: Nurse Practitioner

## 2019-09-05 ENCOUNTER — Other Ambulatory Visit: Payer: Self-pay

## 2019-09-05 ENCOUNTER — Ambulatory Visit (INDEPENDENT_AMBULATORY_CARE_PROVIDER_SITE_OTHER): Payer: 59 | Admitting: Nurse Practitioner

## 2019-09-05 VITALS — BP 136/86

## 2019-09-05 DIAGNOSIS — B9689 Other specified bacterial agents as the cause of diseases classified elsewhere: Secondary | ICD-10-CM

## 2019-09-05 DIAGNOSIS — N898 Other specified noninflammatory disorders of vagina: Secondary | ICD-10-CM | POA: Diagnosis not present

## 2019-09-05 DIAGNOSIS — A599 Trichomoniasis, unspecified: Secondary | ICD-10-CM

## 2019-09-05 DIAGNOSIS — N760 Acute vaginitis: Secondary | ICD-10-CM | POA: Diagnosis not present

## 2019-09-05 LAB — WET PREP FOR TRICH, YEAST, CLUE

## 2019-09-05 MED ORDER — METRONIDAZOLE 500 MG PO TABS: 500.0000 mg | ORAL_TABLET | Freq: Two times a day (BID) | ORAL | 0 refills | Status: DC

## 2019-09-05 NOTE — Patient Instructions (Signed)
Bacterial Vaginosis  Bacterial vaginosis is an infection of the vagina. It happens when too many normal germs (healthy bacteria) grow in the vagina. This infection puts you at risk for infections from sex (STIs). Treating this infection can lower your risk for some STIs. You should also treat this if you are pregnant. It can cause your baby to be born early. Follow these instructions at home: Medicines  Take over-the-counter and prescription medicines only as told by your doctor.  Take or use your antibiotic medicine as told by your doctor. Do not stop taking or using it even if you start to feel better. General instructions  If you your sexual partner is a woman, tell her that you have this infection. She needs to get treatment if she has symptoms. If you have a female partner, he does not need to be treated.  During treatment: ? Avoid sex. ? Do not douche. ? Avoid alcohol as told. ? Avoid breastfeeding as told.  Drink enough fluid to keep your pee (urine) clear or pale yellow.  Keep your vagina and butt (rectum) clean. ? Wash the area with warm water every day. ? Wipe from front to back after you use the toilet.  Keep all follow-up visits as told by your doctor. This is important. Preventing this condition  Do not douche.  Use only warm water to wash around your vagina.  Use protection when you have sex. This includes: ? Latex condoms. ? Dental dams.  Limit how many people you have sex with. It is best to only have sex with the same person (be monogamous).  Get tested for STIs. Have your partner get tested.  Wear underwear that is cotton or lined with cotton.  Avoid tight pants and pantyhose. This is most important in summer.  Do not use any products that have nicotine or tobacco in them. These include cigarettes and e-cigarettes. If you need help quitting, ask your doctor.  Do not use illegal drugs.  Limit how much alcohol you drink. Contact a doctor if:  Your  symptoms do not get better, even after you are treated.  You have more discharge or pain when you pee (urinate).  You have a fever.  You have pain in your belly (abdomen).  You have pain with sex.  Your bleed from your vagina between periods. Summary  This infection happens when too many germs (bacteria) grow in the vagina.  Treating this condition can lower your risk for some infections from sex (STIs).  You should also treat this if you are pregnant. It can cause early (premature) birth.  Do not stop taking or using your antibiotic medicine even if you start to feel better. This information is not intended to replace advice given to you by your health care provider. Make sure you discuss any questions you have with your health care provider. Document Revised: 12/25/2016 Document Reviewed: 09/28/2015 Elsevier Patient Education  [place]. Trichomoniasis Trichomoniasis is an STI (sexually transmitted infection) that can affect both women and men. In women, the outer area of the female genitalia (vulva) and the vagina are affected. In men, mainly the penis is affected, but the prostate and other reproductive organs can also be involved.  This condition can be treated with medicine. It often has no symptoms (is asymptomatic), especially in men. If not treated, trichomoniasis can last for months or years. What are the causes? This condition is caused by a parasite called Trichomonas vaginalis. Trichomoniasis most often spreads from person  to person (is contagious) through sexual contact. What increases the risk? The following factors may make you more likely to develop this condition:  Having unprotected sex.  Having sex with a partner who has trichomoniasis.  Having multiple sexual partners.  Having had previous trichomoniasis infections or other STIs. What are the signs or symptoms? In women, symptoms of trichomoniasis include:  Abnormal vaginal discharge that is  clear, white, gray, or yellow-green and foamy and has an unusual "fishy" odor.  Itching and irritation of the vagina and vulva.  Burning or pain during urination or sex.  Redness and swelling of the genitals. In men, symptoms of trichomoniasis include:  Penile discharge that may be foamy or contain pus.  Pain in the penis. This may happen only when urinating.  Itching or irritation inside the penis.  Burning after urination or ejaculation. How is this diagnosed? In women, this condition may be found during a routine Pap test or physical exam. It may be found in men during a routine physical exam. Your health care provider may do tests to help diagnose this infection, such as:  Urine tests (men and women).  The following in women: ? Testing the pH of the vagina. ? A vaginal swab test that checks for the Trichomonas vaginalis parasite. ? Testing vaginal secretions. Your health care provider may test you for other STIs, including HIV (human immunodeficiency virus). How is this treated? This condition is treated with medicine taken by mouth (orally), such as metronidazole or tinidazole, to fight the infection. Your sexual partner(s) also need to be tested and treated.  If you are a woman and you plan to become pregnant or think you may be pregnant, tell your health care provider right away. Some medicines that are used to treat the infection should not be taken during pregnancy. Your health care provider may recommend over-the-counter medicines or creams to help relieve itching or irritation. You may be tested for infection again 3 months after treatment. Follow these instructions at home:  Take and use over-the-counter and prescription medicines, including creams, only as told by your health care provider.  Take your antibiotic medicine as told by your health care provider. Do not stop taking the antibiotic even if you start to feel better.  Do not have sex until 7-10 days after you  finish your medicine, or until your health care provider approves. Ask your health care provider when you may start to have sex again.  (Women) Do not douche or wear tampons while you have the infection.  Discuss your infection with your sexual partner(s). Make sure that your partner gets tested and treated, if necessary.  Keep all follow-up visits as told by your health care provider. This is important. How is this prevented?   Use condoms every time you have sex. Using condoms correctly and consistently can help protect against STIs.  Avoid having multiple sexual partners.  Talk with your sexual partner about any symptoms that either of you may have, as well as any history of STIs.  Get tested for STIs and STDs (sexually transmitted diseases) before you have sex. Ask your partner to do the same.  Do not have sexual contact if you have symptoms of trichomoniasis or another STI. Contact a health care provider if:  You still have symptoms after you finish your medicine.  You develop pain in your abdomen.  You have pain when you urinate.  You have bleeding after sex.  You develop a rash.  You feel nauseous  or you vomit.  You plan to become pregnant or think you may be pregnant. Summary  Trichomoniasis is an STI (sexually transmitted infection) that can affect both women and men.  This condition often has no symptoms (is asymptomatic), especially in men.  Without treatment, this condition can last for months or years.  You should not have sex until 7-10 days after you finish your medicine, or until your health care provider approves. Ask your health care provider when you may start to have sex again.  Discuss your infection with your sexual partner(s). Make sure that your partner gets tested and treated, if necessary. This information is not intended to replace advice given to you by your health care provider. Make sure you discuss any questions you have with your health  care provider. Document Revised: 10/26/2017 Document Reviewed: 10/26/2017 Elsevier Patient Education  [place].

## 2019-09-05 NOTE — Progress Notes (Signed)
   Acute Office Visit  Subjective:    Patient ID: Tabitha Mcconnell, female    DOB: [birth year], 44 y.o.   MRN: [id number]   HPI 44 y.o. presents today for clear, watery vaginal discharge that she noticed a couple of mornings ago when awaking. Denies vaginal itching or odor and urinary symptoms. Sexually active with one partner.    Review of Systems  Constitutional: Negative.   Gastrointestinal: Negative.   Genitourinary: Positive for vaginal discharge (clear, watery). Negative for dysuria, frequency and urgency.       Objective:    Physical Exam Constitutional:      Appearance: Normal appearance.  Abdominal:     General: There is no distension.     Palpations: Abdomen is soft.     Tenderness: There is no abdominal tenderness. There is no right CVA tenderness or left CVA tenderness.  Genitourinary:    General: Normal vulva.     Vagina: Vaginal discharge (thick, white) present. No erythema or tenderness.     Cervix: Normal.     Uterus: Normal.      BP 136/86  Wt Readings from Last 3 Encounters:  04/19/19 207 lb (93.9 kg)  04/18/18 207 lb (93.9 kg)  04/12/17 221 lb (100.2 kg)   Wet prep: + clue cells, + trichomonas     Assessment & Plan:   Problem List Items Addressed This Visit    None    Visit Diagnoses    Trichimoniasis    -  Primary   Relevant Medications   metroNIDAZOLE (FLAGYL) 500 MG tablet   Vaginal discharge       Relevant Orders   WET PREP FOR TRICH, YEAST, CLUE   Bacterial vaginosis       Relevant Medications   metroNIDAZOLE (FLAGYL) 500 MG tablet     Plan: Wet prep positive for clue cells and trichomonas.  Flagyl 500 mg twice a day for 7 days.  Instructed to complete full course of antibiotic and avoid alcohol use.  Recommend informing partner so he can be treated as well.  No sexual intercourse until completion of course.  She is agreeable to plan.     Tamela Gammon [facility], 9:07 AM 09/05/2019

## 2020-04-22 ENCOUNTER — Encounter: Payer: Self-pay | Admitting: Nurse Practitioner

## 2020-04-22 ENCOUNTER — Ambulatory Visit (INDEPENDENT_AMBULATORY_CARE_PROVIDER_SITE_OTHER): Payer: 59 | Admitting: Nurse Practitioner

## 2020-04-22 ENCOUNTER — Other Ambulatory Visit: Payer: Self-pay

## 2020-04-22 VITALS — BP 122/80 | Ht 61.0 in | Wt 195.0 lb

## 2020-04-22 DIAGNOSIS — Z01419 Encounter for gynecological examination (general) (routine) without abnormal findings: Secondary | ICD-10-CM | POA: Diagnosis not present

## 2020-04-22 DIAGNOSIS — Z9889 Other specified postprocedural states: Secondary | ICD-10-CM | POA: Diagnosis not present

## 2020-04-22 DIAGNOSIS — Z113 Encounter for screening for infections with a predominantly sexual mode of transmission: Secondary | ICD-10-CM | POA: Diagnosis not present

## 2020-04-22 DIAGNOSIS — L659 Nonscarring hair loss, unspecified: Secondary | ICD-10-CM

## 2020-04-22 NOTE — Patient Instructions (Signed)
Health Maintenance, Female Adopting a healthy lifestyle and getting preventive care are important in promoting health and wellness. Ask your health care provider about:  The right schedule for you to have regular tests and exams.  Things you can do on your own to prevent diseases and keep yourself healthy. What should I know about diet, weight, and exercise? Eat a healthy diet  Eat a diet that includes plenty of vegetables, fruits, low-fat dairy products, and lean protein.  Do not eat a lot of foods that are high in solid fats, added sugars, or sodium.   Maintain a healthy weight Body mass index (BMI) is used to identify weight problems. It estimates body fat based on height and weight. Your health care provider can help determine your BMI and help you achieve or maintain a healthy weight. Get regular exercise Get regular exercise. This is one of the most important things you can do for your health. Most adults should:  Exercise for at least 150 minutes each week. The exercise should increase your heart rate and make you sweat (moderate-intensity exercise).  Do strengthening exercises at least twice a week. This is in addition to the moderate-intensity exercise.  Spend less time sitting. Even light physical activity can be beneficial. Watch cholesterol and blood lipids Have your blood tested for lipids and cholesterol at 46 years of age, then have this test every 5 years. Have your cholesterol levels checked more often if:  Your lipid or cholesterol levels are high.  You are older than 46 years of age.  You are at high risk for heart disease. What should I know about cancer screening? Depending on your health history and family history, you may need to have cancer screening at various ages. This may include screening for:  Breast cancer.  Cervical cancer.  Colorectal cancer.  Skin cancer.  Lung cancer. What should I know about heart disease, diabetes, and high blood  pressure? Blood pressure and heart disease  High blood pressure causes heart disease and increases the risk of stroke. This is more likely to develop in people who have high blood pressure readings, are of African descent, or are overweight.  Have your blood pressure checked: ? Every 3-5 years if you are 18-39 years of age. ? Every year if you are 40 years old or older. Diabetes Have regular diabetes screenings. This checks your fasting blood sugar level. Have the screening done:  Once every three years after age 40 if you are at a normal weight and have a low risk for diabetes.  More often and at a younger age if you are overweight or have a high risk for diabetes. What should I know about preventing infection? Hepatitis B If you have a higher risk for hepatitis B, you should be screened for this virus. Talk with your health care provider to find out if you are at risk for hepatitis B infection. Hepatitis C Testing is recommended for:  Everyone born from 1945 through 1965.  Anyone with known risk factors for hepatitis C. Sexually transmitted infections (STIs)  Get screened for STIs, including gonorrhea and chlamydia, if: ? You are sexually active and are younger than 46 years of age. ? You are older than 46 years of age and your health care provider tells you that you are at risk for this type of infection. ? Your sexual activity has changed since you were last screened, and you are at increased risk for chlamydia or gonorrhea. Ask your health care provider   if you are at risk.  Ask your health care provider about whether you are at high risk for HIV. Your health care provider may recommend a prescription medicine to help prevent HIV infection. If you choose to take medicine to prevent HIV, you should first get tested for HIV. You should then be tested every 3 months for as long as you are taking the medicine. Pregnancy  If you are about to stop having your period (premenopausal) and  you may become pregnant, seek counseling before you get pregnant.  Take 400 to 800 micrograms (mcg) of folic acid every day if you become pregnant.  Ask for birth control (contraception) if you want to prevent pregnancy. Osteoporosis and menopause Osteoporosis is a disease in which the bones lose minerals and strength with aging. This can result in bone fractures. If you are 65 years old or older, or if you are at risk for osteoporosis and fractures, ask your health care provider if you should:  Be screened for bone loss.  Take a calcium or vitamin D supplement to lower your risk of fractures.  Be given hormone replacement therapy (HRT) to treat symptoms of menopause. Follow these instructions at home: Lifestyle  Do not use any products that contain nicotine or tobacco, such as cigarettes, e-cigarettes, and chewing tobacco. If you need help quitting, ask your health care provider.  Do not use street drugs.  Do not share needles.  Ask your health care provider for help if you need support or information about quitting drugs. Alcohol use  Do not drink alcohol if: ? Your health care provider tells you not to drink. ? You are pregnant, may be pregnant, or are planning to become pregnant.  If you drink alcohol: ? Limit how much you use to 0-1 drink a day. ? Limit intake if you are breastfeeding.  Be aware of how much alcohol is in your drink. In the [place] one drink equals one 12 oz bottle of beer (355 mL), one 5 oz glass of wine (148 mL), or one 1 oz glass of hard liquor (44 mL). General instructions  Schedule regular health, dental, and eye exams.  Stay current with your vaccines.  Tell your health care provider if: ? You often feel depressed. ? You have ever been abused or do not feel safe at home. Summary  Adopting a healthy lifestyle and getting preventive care are important in promoting health and wellness.  Follow your health care provider's instructions about healthy  diet, exercising, and getting tested or screened for diseases.  Follow your health care provider's instructions on monitoring your cholesterol and blood pressure. This information is not intended to replace advice given to you by your health care provider. Make sure you discuss any questions you have with your health care provider. Document Revised: 01/05/2018 Document Reviewed: 01/05/2018 Elsevier Patient Education  2021 Elsevier Inc.  

## 2020-04-22 NOTE — Progress Notes (Signed)
   Tabitha Mcconnell [birth year] [id number]   History:  45 y.o. G1P1 presents for annual exam. She complains of hair loss that she noticed a couple of months ago. Normal pap and mammogram history. Amenorrheic. 2018 endometrial ablation and benign endometrial polyps. Sexually active with one partner, would like STD screening today.  Gynecologic History No LMP recorded. Patient has had an ablation.   Contraception/Family planning: none  Health Maintenance Last Pap: 04/19/2019. Results were: normal Last mammogram: 05/05/2019. Results were: normal Last colonoscopy: N/A Last Dexa: 2009   Past medical history, past surgical history, family history and social history were all reviewed and documented in the EPIC chart. Works for [employer]. 75 yo son.   ROS:  A ROS was performed and pertinent positives and negatives are included.  Exam:  Vitals:   04/22/20 1602  BP: 122/80  Weight: 195 lb (88.5 kg)  Height: 5' 1"  (1.549 m)   Body mass index is 36.84 kg/m.  General appearance:  Normal Thyroid:  Symmetrical, normal in size, without palpable masses or nodularity. Respiratory  Auscultation:  Clear without wheezing or rhonchi Cardiovascular  Auscultation:  Regular rate, without rubs, murmurs or gallops  Edema/varicosities:  Not grossly evident Abdominal  Soft,nontender, without masses, guarding or rebound.  Liver/spleen:  No organomegaly noted  Hernia:  None appreciated  Skin  Inspection:  Grossly normal   Breasts: Examined lying and sitting.   Right: Without masses, retractions, discharge or axillary adenopathy.   Left: Without masses, retractions, discharge or axillary adenopathy. Gentitourinary   Inguinal/mons:  Normal without inguinal adenopathy  External genitalia:  Normal  BUS/Urethra/Skene's glands:  Normal  Vagina:  Normal  Cervix:  Normal  Uterus:  Normal in size, shape and contour.  Midline and mobile  Adnexa/parametria:     Rt: Without masses or tenderness.   Lt: Without  masses or tenderness.  Anus and perineum: Normal  Digital rectal exam: Normal sphincter tone without palpated masses or tenderness  Assessment/Plan:  45 y.o. G1P1 for annual exam.   Well female exam with routine gynecological exam - Plan: CBC with Differential/Platelet, Comprehensive metabolic panel. Education provided on SBEs, importance of preventative screenings, current guidelines, high calcium diet, regular exercise, and multivitamin daily.   Hair loss - Plan: VITAMIN D 25 Hydroxy (Vit-D Deficiency, Fractures), I62, Follicle stimulating hormone. Noticed this about 2 months ago. If lab work normal we will send referral to dermatology.   History of endometrial ablation - Plan: Follicle stimulating hormone. Amenorrheic  Screen for STD (sexually transmitted disease) - Plan: SURESWAB CT/NG/T. vaginalis, RPR, HIV Antibody (routine testing w rflx)  Screening for cervical cancer - Normal Pap history.  Will repeat at 5-year interval per guidelines.  Screening for breast cancer - Normal mammogram history.  Continue annual screenings.  Normal breast exam today.  Return in 1 year for annual.    Tamela Gammon DNP, 4:10 PM 04/22/2020

## 2020-04-23 LAB — CBC WITH DIFFERENTIAL/PLATELET
Absolute Monocytes: 883 cells/uL (ref 200–950)
Basophils Absolute: 83 cells/uL (ref 0–200)
Basophils Relative: 0.9 %
Eosinophils Absolute: 64 cells/uL (ref 15–500)
Eosinophils Relative: 0.7 %
HCT: 43.5 % (ref 35.0–45.0)
Hemoglobin: 14.9 g/dL (ref 11.7–15.5)
Lymphs Abs: 2935 cells/uL (ref 850–3900)
MCH: 31.4 pg (ref 27.0–33.0)
MCHC: 34.3 g/dL (ref 32.0–36.0)
MCV: 91.6 fL (ref 80.0–100.0)
MPV: 10.7 fL (ref 7.5–12.5)
Monocytes Relative: 9.6 %
Neutro Abs: 5235 cells/uL (ref 1500–7800)
Neutrophils Relative %: 56.9 %
Platelets: 282 10*3/uL (ref 140–400)
RBC: 4.75 10*6/uL (ref 3.80–5.10)
RDW: 13 % (ref 11.0–15.0)
Total Lymphocyte: 31.9 %
WBC: 9.2 10*3/uL (ref 3.8–10.8)

## 2020-04-23 LAB — COMPREHENSIVE METABOLIC PANEL
AG Ratio: 1.5 (calc) (ref 1.0–2.5)
ALT: 20 U/L (ref 6–29)
AST: 18 U/L (ref 10–35)
Albumin: 4 g/dL (ref 3.6–5.1)
Alkaline phosphatase (APISO): 98 U/L (ref 31–125)
BUN: 10 mg/dL (ref 7–25)
CO2: 25 mmol/L (ref 20–32)
Calcium: 9.5 mg/dL (ref 8.6–10.2)
Chloride: 105 mmol/L (ref 98–110)
Creat: 0.78 mg/dL (ref 0.50–1.10)
Globulin: 2.7 g/dL (calc) (ref 1.9–3.7)
Glucose, Bld: 79 mg/dL (ref 65–99)
Potassium: 3.9 mmol/L (ref 3.5–5.3)
Sodium: 140 mmol/L (ref 135–146)
Total Bilirubin: 0.3 mg/dL (ref 0.2–1.2)
Total Protein: 6.7 g/dL (ref 6.1–8.1)

## 2020-04-23 LAB — RPR: RPR Ser Ql: NONREACTIVE

## 2020-04-23 LAB — FOLLICLE STIMULATING HORMONE: FSH: 8.4 m[IU]/mL

## 2020-04-23 LAB — VITAMIN B12: Vitamin B-12: 297 pg/mL (ref 200–1100)

## 2020-04-23 LAB — VITAMIN D 25 HYDROXY (VIT D DEFICIENCY, FRACTURES): Vit D, 25-Hydroxy: 25 ng/mL — ABNORMAL LOW (ref 30–100)

## 2020-04-23 LAB — HIV ANTIBODY (ROUTINE TESTING W REFLEX): HIV 1&2 Ab, 4th Generation: NONREACTIVE

## 2020-04-24 ENCOUNTER — Other Ambulatory Visit: Payer: Self-pay | Admitting: Nurse Practitioner

## 2020-04-24 DIAGNOSIS — E559 Vitamin D deficiency, unspecified: Secondary | ICD-10-CM

## 2020-04-24 LAB — SURESWAB CT/NG/T. VAGINALIS
C. trachomatis RNA, TMA: NOT DETECTED
N. gonorrhoeae RNA, TMA: NOT DETECTED
Trichomonas vaginalis RNA: NOT DETECTED

## 2020-04-24 MED ORDER — VITAMIN D (ERGOCALCIFEROL) 1.25 MG (50000 UNIT) PO CAPS: 50000.0000 [IU] | ORAL_CAPSULE | ORAL | 0 refills | Status: AC

## 2020-06-14 ENCOUNTER — Other Ambulatory Visit: Payer: Self-pay | Admitting: Nurse Practitioner

## 2020-06-29 ENCOUNTER — Other Ambulatory Visit: Payer: Self-pay | Admitting: Nurse Practitioner

## 2020-07-19 ENCOUNTER — Other Ambulatory Visit: Payer: Self-pay | Admitting: Nurse Practitioner

## 2020-07-19 DIAGNOSIS — Z1231 Encounter for screening mammogram for malignant neoplasm of breast: Secondary | ICD-10-CM

## 2020-09-11 ENCOUNTER — Ambulatory Visit: Payer: 59

## 2020-09-25 ENCOUNTER — Ambulatory Visit: Payer: 59

## 2020-10-10 ENCOUNTER — Other Ambulatory Visit: Payer: Self-pay | Admitting: Nurse Practitioner

## 2020-10-10 DIAGNOSIS — Z1231 Encounter for screening mammogram for malignant neoplasm of breast: Secondary | ICD-10-CM

## 2020-10-15 ENCOUNTER — Other Ambulatory Visit: Payer: Self-pay

## 2020-10-15 ENCOUNTER — Ambulatory Visit
Admission: RE | Admit: 2020-10-15 | Discharge: 2020-10-15 | Disposition: A | Discharge status: Home / Self Care | Payer: 59 | Source: Ambulatory Visit

## 2020-10-15 DIAGNOSIS — Z1231 Encounter for screening mammogram for malignant neoplasm of breast: Secondary | ICD-10-CM

## 2021-04-23 ENCOUNTER — Ambulatory Visit: Payer: 59 | Admitting: Nurse Practitioner

## 2021-05-06 ENCOUNTER — Encounter: Payer: Self-pay | Admitting: Nurse Practitioner

## 2021-05-06 ENCOUNTER — Ambulatory Visit (INDEPENDENT_AMBULATORY_CARE_PROVIDER_SITE_OTHER): Payer: 59 | Admitting: Nurse Practitioner

## 2021-05-06 VITALS — BP 122/80 | Ht 60.0 in | Wt 214.0 lb

## 2021-05-06 DIAGNOSIS — Z1211 Encounter for screening for malignant neoplasm of colon: Secondary | ICD-10-CM | POA: Diagnosis not present

## 2021-05-06 DIAGNOSIS — Z01419 Encounter for gynecological examination (general) (routine) without abnormal findings: Secondary | ICD-10-CM

## 2021-05-06 DIAGNOSIS — E559 Vitamin D deficiency, unspecified: Secondary | ICD-10-CM | POA: Diagnosis not present

## 2021-05-06 DIAGNOSIS — E7800 Pure hypercholesterolemia, unspecified: Secondary | ICD-10-CM

## 2021-05-06 DIAGNOSIS — Z113 Encounter for screening for infections with a predominantly sexual mode of transmission: Secondary | ICD-10-CM

## 2021-05-06 NOTE — Progress Notes (Signed)
? ?  Tabitha Mcconnell 1975/05/20 [id number] ? ? ?History:  46 y.o. G1P1 presents for annual exam. Amenorrheic since endometrial ablation in 2018. Also had benign endometrial polyps at that time.  Normal pap and mammogram history.  ? ?Gynecologic History ?No LMP recorded. Patient has had an ablation. ?  ?Contraception/Family planning: none ?Sexually active: Yes ? ?Health Maintenance ?Last Pap: 04/19/2019. Results were: Normal, 5-year repeat ?Last mammogram: 10/15/2020. Results were: Normal ?Last colonoscopy: Never ?Last Dexa: 2009 ?  ?Past medical history, past surgical history, family history and social history were all reviewed and documented in the EPIC chart. Works for [employer]. 44 yo son.  ? ?ROS:  A ROS was performed and pertinent positives and negatives are included. ? ?Exam: ? ?Vitals:  ? 05/06/21 1351  ?BP: 122/80  ?Weight: 214 lb (97.1 kg)  ?Height: 5' (1.524 m)  ? ? ?Body mass index is 41.79 kg/m?. ? ?General appearance:  Normal ?Thyroid:  Symmetrical, normal in size, without palpable masses or nodularity. ?Respiratory ? Auscultation:  Clear without wheezing or rhonchi ?Cardiovascular ? Auscultation:  Regular rate, without rubs, murmurs or gallops ? Edema/varicosities:  Not grossly evident ?Abdominal ? Soft,nontender, without masses, guarding or rebound. ? Liver/spleen:  No organomegaly noted ? Hernia:  None appreciated ? Skin ? Inspection:  Grossly normal ?  ?Breasts: Examined lying and sitting.  ? Right: Without masses, retractions, discharge or axillary adenopathy. ? ? Left: Without masses, retractions, discharge or axillary adenopathy. ?Genitourinary  ? Inguinal/mons:  Normal without inguinal adenopathy ? External genitalia:  Normal appearing vulva with no masses, tenderness, or lesions ? BUS/Urethra/Skene's glands:  Normal ? Vagina:  Normal appearing with normal color and discharge, no lesions ? Cervix:  Normal appearing without discharge or lesions ? Uterus:  Normal in size, shape and contour.  Midline and  mobile, nontender ? Adnexa/parametria:   ?  Rt: Normal in size, without masses or tenderness. ?  Lt: Normal in size, without masses or tenderness. ? Anus and perineum: Normal ? Digital rectal exam: Normal sphincter tone without palpated masses or tenderness ? ?Patient informed chaperone available to be present for breast and pelvic exam. Patient has requested no chaperone to be present. Patient has been advised what will be completed during breast and pelvic exam.  ? ?Assessment/Plan:  46 y.o. G1P1 for annual exam.  ? ?Well female exam with routine gynecological exam - Plan: CBC with Differential/Platelet, Comprehensive metabolic panel. Education provided on SBEs, importance of preventative screenings, current guidelines, high calcium diet, regular exercise, and multivitamin daily.  ? ?Vitamin D deficiency - Plan: VITAMIN D 25 Hydroxy (Vit-D Deficiency, Fractures) ? ?Elevated LDL cholesterol level - Plan: Lipid panel ? ?Screening for colon cancer - Plan: Cologuard ? ?Screen for STD (sexually transmitted disease) - Plan: RPR, SURESWAB CT/NG/T. vaginalis, HIV Antibody (routine testing w rflx) ? ?Screening for cervical cancer - Normal Pap history.  Will repeat at 5-year interval per guidelines. ? ?Screening for breast cancer - Normal mammogram history.  Continue annual screenings.  Normal breast exam today. ? ?Return in 1 year for annual.  ? ? ? ? ?Tamela Gammon DNP, 2:21 PM 05/06/2021 ? ?

## 2021-05-07 LAB — RPR: RPR Ser Ql: NONREACTIVE

## 2021-05-07 LAB — CBC WITH DIFFERENTIAL/PLATELET
Absolute Monocytes: 753 cells/uL (ref 200–950)
Basophils Absolute: 43 cells/uL (ref 0–200)
Basophils Relative: 0.6 %
Eosinophils Absolute: 192 cells/uL (ref 15–500)
Eosinophils Relative: 2.7 %
HCT: 43.6 % (ref 35.0–45.0)
Hemoglobin: 15.1 g/dL (ref 11.7–15.5)
Lymphs Abs: 3294 cells/uL (ref 850–3900)
MCH: 31 pg (ref 27.0–33.0)
MCHC: 34.6 g/dL (ref 32.0–36.0)
MCV: 89.5 fL (ref 80.0–100.0)
MPV: 11.3 fL (ref 7.5–12.5)
Monocytes Relative: 10.6 %
Neutro Abs: 2819 cells/uL (ref 1500–7800)
Neutrophils Relative %: 39.7 %
Platelets: 274 10*3/uL (ref 140–400)
RBC: 4.87 10*6/uL (ref 3.80–5.10)
RDW: 12.6 % (ref 11.0–15.0)
Total Lymphocyte: 46.4 %
WBC: 7.1 10*3/uL (ref 3.8–10.8)

## 2021-05-07 LAB — COMPREHENSIVE METABOLIC PANEL
AG Ratio: 1.6 (calc) (ref 1.0–2.5)
ALT: 19 U/L (ref 6–29)
AST: 21 U/L (ref 10–35)
Albumin: 4.6 g/dL (ref 3.6–5.1)
Alkaline phosphatase (APISO): 103 U/L (ref 31–125)
BUN: 8 mg/dL (ref 7–25)
CO2: 18 mmol/L — ABNORMAL LOW (ref 20–32)
Calcium: 10 mg/dL (ref 8.6–10.2)
Chloride: 115 mmol/L — ABNORMAL HIGH (ref 98–110)
Creat: 0.84 mg/dL (ref 0.50–0.99)
Globulin: 2.8 g/dL (calc) (ref 1.9–3.7)
Glucose, Bld: 90 mg/dL (ref 65–99)
Potassium: 4.7 mmol/L (ref 3.5–5.3)
Sodium: 146 mmol/L (ref 135–146)
Total Bilirubin: 0.4 mg/dL (ref 0.2–1.2)
Total Protein: 7.4 g/dL (ref 6.1–8.1)

## 2021-05-07 LAB — LIPID PANEL
Cholesterol: 208 mg/dL — ABNORMAL HIGH (ref <200)
HDL: 51 mg/dL (ref >=50)
LDL Cholesterol (Calc): 136 mg/dL (calc) — ABNORMAL HIGH
Non-HDL Cholesterol (Calc): 157 mg/dL (calc) — ABNORMAL HIGH (ref <130)
Total CHOL/HDL Ratio: 4.1 (calc) (ref <5.0)
Triglycerides: 99 mg/dL (ref <150)

## 2021-05-07 LAB — VITAMIN D 25 HYDROXY (VIT D DEFICIENCY, FRACTURES): Vit D, 25-Hydroxy: 22 ng/mL — ABNORMAL LOW (ref 30–100)

## 2021-05-07 LAB — HIV ANTIBODY (ROUTINE TESTING W REFLEX): HIV 1&2 Ab, 4th Generation: NONREACTIVE

## 2021-05-07 LAB — SURESWAB CT/NG/T. VAGINALIS
C. trachomatis RNA, TMA: NOT DETECTED
N. gonorrhoeae RNA, TMA: NOT DETECTED
Trichomonas vaginalis RNA: NOT DETECTED

## 2021-05-08 ENCOUNTER — Other Ambulatory Visit: Payer: Self-pay | Admitting: Nurse Practitioner

## 2021-05-08 DIAGNOSIS — E559 Vitamin D deficiency, unspecified: Secondary | ICD-10-CM

## 2021-05-08 MED ORDER — VITAMIN D (ERGOCALCIFEROL) 1.25 MG (50000 UNIT) PO CAPS: 50000.0000 [IU] | ORAL_CAPSULE | ORAL | 0 refills | Status: AC

## 2021-06-03 LAB — COLOGUARD: COLOGUARD: NEGATIVE

## 2021-07-07 ENCOUNTER — Other Ambulatory Visit: Payer: Self-pay | Admitting: Nurse Practitioner

## 2022-06-18 ENCOUNTER — Ambulatory Visit (INDEPENDENT_AMBULATORY_CARE_PROVIDER_SITE_OTHER): Payer: 59 | Admitting: Nurse Practitioner

## 2022-06-18 ENCOUNTER — Encounter: Payer: Self-pay | Admitting: Nurse Practitioner

## 2022-06-18 VITALS — BP 130/88 | Ht 60.0 in | Wt 214.0 lb

## 2022-06-18 DIAGNOSIS — E559 Vitamin D deficiency, unspecified: Secondary | ICD-10-CM

## 2022-06-18 DIAGNOSIS — N941 Unspecified dyspareunia: Secondary | ICD-10-CM

## 2022-06-18 DIAGNOSIS — Z01419 Encounter for gynecological examination (general) (routine) without abnormal findings: Secondary | ICD-10-CM

## 2022-06-18 DIAGNOSIS — E785 Hyperlipidemia, unspecified: Secondary | ICD-10-CM

## 2022-06-18 NOTE — Progress Notes (Signed)
   Tabitha Mcconnell December 07, 1975 [id number]   History:  47 y.o. G1P1 presents for annual exam. Amenorrheic since endometrial ablation in 2018. Some pain with intercourse, not using lubrication. Also had benign endometrial polyps at that time. Normal pap and mammogram history.   Gynecologic History No LMP recorded. Patient has had an ablation.   Contraception/Family planning: condoms Sexually active: Yes, declines STD screening  Health Maintenance Last Pap: 04/19/2019. Results were: Normal neg HPV, 5-year repeat Last mammogram: 10/15/2020. Results were: Normal Last colonoscopy: Never. Negative Cologuard 05/22/2021 Last Dexa: 01/16/2008   Past medical history, past surgical history, family history and social history were all reviewed and documented in the EPIC chart. Works for [employer]. 84 yo son.   ROS:  A ROS was performed and pertinent positives and negatives are included.  Exam:  Vitals:   06/18/22 1558  BP: 130/88  Weight: 214 lb (97.1 kg)  Height: 5' (1.524 m)     Body mass index is 41.79 kg/m.  General appearance:  Normal Thyroid:  Symmetrical, normal in size, without palpable masses or nodularity. Respiratory  Auscultation:  Clear without wheezing or rhonchi Cardiovascular  Auscultation:  Regular rate, without rubs, murmurs or gallops  Edema/varicosities:  Not grossly evident Abdominal  Soft,nontender, without masses, guarding or rebound.  Liver/spleen:  No organomegaly noted  Hernia:  None appreciated  Skin  Inspection:  Grossly normal   Breasts: Examined lying and sitting.   Right: Without masses, retractions, discharge or axillary adenopathy.   Left: Without masses, retractions, discharge or axillary adenopathy. Genitourinary   Inguinal/mons:  Normal without inguinal adenopathy  External genitalia:  Normal appearing vulva with no masses, tenderness, or lesions  BUS/Urethra/Skene's glands:  Normal  Vagina:  Normal appearing with normal color and discharge, no  lesions  Cervix:  Normal appearing without discharge or lesions  Uterus:  Normal in size, shape and contour.  Midline and mobile, nontender  Adnexa/parametria:     Rt: Normal in size, without masses or tenderness.   Lt: Normal in size, without masses or tenderness.  Anus and perineum: Normal  Digital rectal exam: Deferred  Patient informed chaperone available to be present for breast and pelvic exam. Patient has requested no chaperone to be present. Patient has been advised what will be completed during breast and pelvic exam.   Assessment/Plan:  47 y.o. G1P1 for annual exam.   Well female exam with routine gynecological exam - Plan: CBC with Differential/Platelet, Comprehensive metabolic panel. Education provided on SBEs, importance of preventative screenings, current guidelines, high calcium diet, regular exercise, and multivitamin daily.   Vitamin D deficiency - Plan: VITAMIN D 25 Hydroxy (Vit-D Deficiency, Fractures)  Elevated LDL cholesterol level - Plan: Lipid panel  Dyspareunia in female - Recommend oil or silicone-based lubricants. Discussed option for vaginal estrogen if no improvement with OTC treatments.   Screening for cervical cancer - Normal Pap history.  Will repeat at 5-year interval per guidelines.  Screening for breast cancer - Normal mammogram history. Overdue and plans to schedule now. Normal breast exam today.  Screening for cervical cancer - Normal Pap history.  Will repeat at 5-year interval per guidelines.  Screening for breast cancer - Normal mammogram history.  Continue annual screenings.  Normal breast exam today.  Screening for colon cancer - Negative Cologuard 04/2021. Will repeat at 3-year interval per guidelines.    Return in 1 year for annual.      Olivia Mackie DNP, 4:19 PM 06/18/2022

## 2022-06-19 LAB — CBC WITH DIFFERENTIAL/PLATELET
Absolute Monocytes: 818 cells/uL (ref 200–950)
Basophils Absolute: 53 cells/uL (ref 0–200)
Basophils Relative: 0.7 %
Eosinophils Absolute: 90 cells/uL (ref 15–500)
Eosinophils Relative: 1.2 %
HCT: 42.4 % (ref 35.0–45.0)
Hemoglobin: 14.1 g/dL (ref 11.7–15.5)
Lymphs Abs: 3353 cells/uL (ref 850–3900)
MCH: 30.1 pg (ref 27.0–33.0)
MCHC: 33.3 g/dL (ref 32.0–36.0)
MCV: 90.6 fL (ref 80.0–100.0)
MPV: 10.7 fL (ref 7.5–12.5)
Monocytes Relative: 10.9 %
Neutro Abs: 3188 cells/uL (ref 1500–7800)
Neutrophils Relative %: 42.5 %
Platelets: 280 10*3/uL (ref 140–400)
RBC: 4.68 10*6/uL (ref 3.80–5.10)
RDW: 12.8 % (ref 11.0–15.0)
Total Lymphocyte: 44.7 %
WBC: 7.5 10*3/uL (ref 3.8–10.8)

## 2022-06-19 LAB — COMPREHENSIVE METABOLIC PANEL
AG Ratio: 1.7 (calc) (ref 1.0–2.5)
ALT: 15 U/L (ref 6–29)
AST: 15 U/L (ref 10–35)
Albumin: 4.2 g/dL (ref 3.6–5.1)
Alkaline phosphatase (APISO): 99 U/L (ref 31–125)
BUN: 11 mg/dL (ref 7–25)
CO2: 27 mmol/L (ref 20–32)
Calcium: 9.5 mg/dL (ref 8.6–10.2)
Chloride: 105 mmol/L (ref 98–110)
Creat: 0.77 mg/dL (ref 0.50–0.99)
Globulin: 2.5 g/dL (calc) (ref 1.9–3.7)
Glucose, Bld: 81 mg/dL (ref 65–99)
Potassium: 3.9 mmol/L (ref 3.5–5.3)
Sodium: 139 mmol/L (ref 135–146)
Total Bilirubin: 0.3 mg/dL (ref 0.2–1.2)
Total Protein: 6.7 g/dL (ref 6.1–8.1)

## 2022-06-19 LAB — LIPID PANEL
Cholesterol: 162 mg/dL (ref <200)
HDL: 55 mg/dL (ref >=50)
LDL Cholesterol (Calc): 92 mg/dL (calc)
Non-HDL Cholesterol (Calc): 107 mg/dL (calc) (ref <130)
Total CHOL/HDL Ratio: 2.9 (calc) (ref <5.0)
Triglycerides: 63 mg/dL (ref <150)

## 2022-06-19 LAB — VITAMIN D 25 HYDROXY (VIT D DEFICIENCY, FRACTURES): Vit D, 25-Hydroxy: 25 ng/mL — ABNORMAL LOW (ref 30–100)

## 2022-06-23 ENCOUNTER — Telehealth: Payer: Self-pay

## 2022-06-23 ENCOUNTER — Other Ambulatory Visit: Payer: Self-pay | Admitting: Nurse Practitioner

## 2022-06-23 DIAGNOSIS — E559 Vitamin D deficiency, unspecified: Secondary | ICD-10-CM

## 2022-06-23 MED ORDER — VITAMIN D (ERGOCALCIFEROL) 1.25 MG (50000 UNIT) PO CAPS: 50000.0000 [IU] | ORAL_CAPSULE | ORAL | 0 refills | Status: AC

## 2022-06-23 NOTE — Telephone Encounter (Signed)
Pt called to request Vit D rx be sent to [place] location versus [place].   Called [facility] in [place] and inquired if they could see rx in system and fill rx for pt and they advised yes and will have ready for pt in about an hour. Pt notified and voiced understanding.  Will route to provider and close.

## 2022-07-22 ENCOUNTER — Other Ambulatory Visit: Payer: Self-pay | Admitting: Nurse Practitioner

## 2022-07-22 DIAGNOSIS — E559 Vitamin D deficiency, unspecified: Secondary | ICD-10-CM

## 2022-08-08 IMAGING — MG MM DIGITAL SCREENING BILAT W/ TOMO AND CAD
8 series · 8 of 24 positions shown · non-contrast
Comparison: Previous exam(s).

CLINICAL DATA: Screening.

EXAM:
DIGITAL SCREENING BILATERAL MAMMOGRAM WITH TOMOSYNTHESIS AND CAD
TECHNIQUE: Bilateral screening digital craniocaudal and mediolateral oblique
mammograms were obtained. Bilateral screening digital breast
tomosynthesis was performed. The images were evaluated with
computer-aided detection.

[R MLO synth-2D]
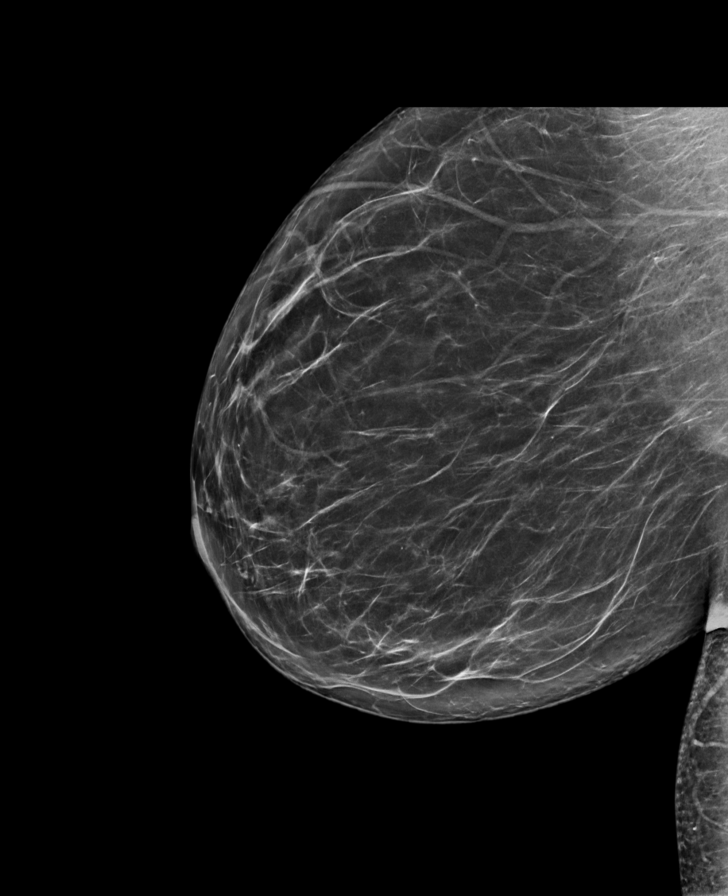

[L CC synth-2D]
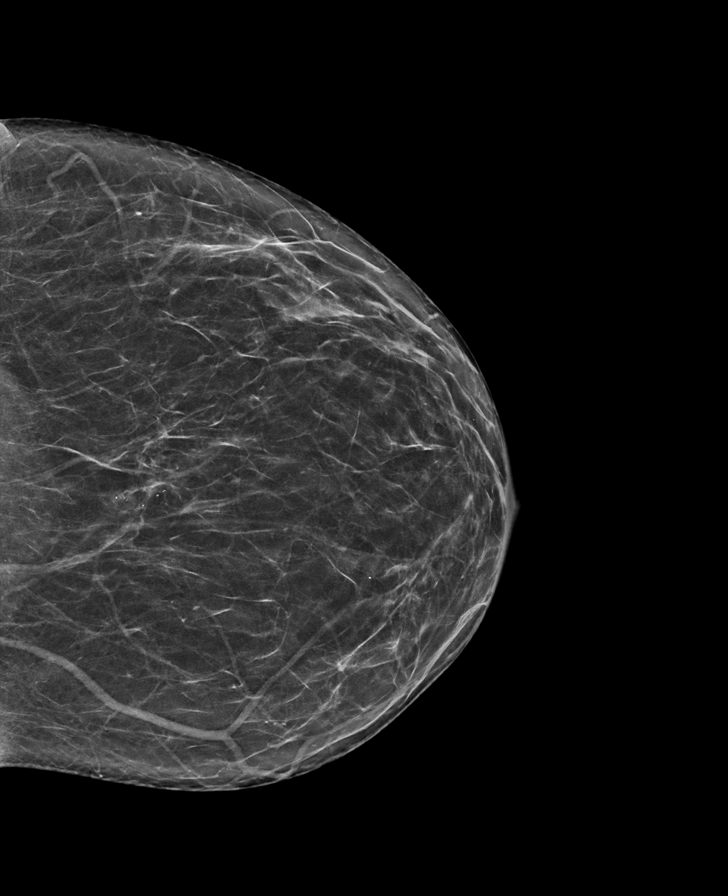

[L MLO synth-2D]
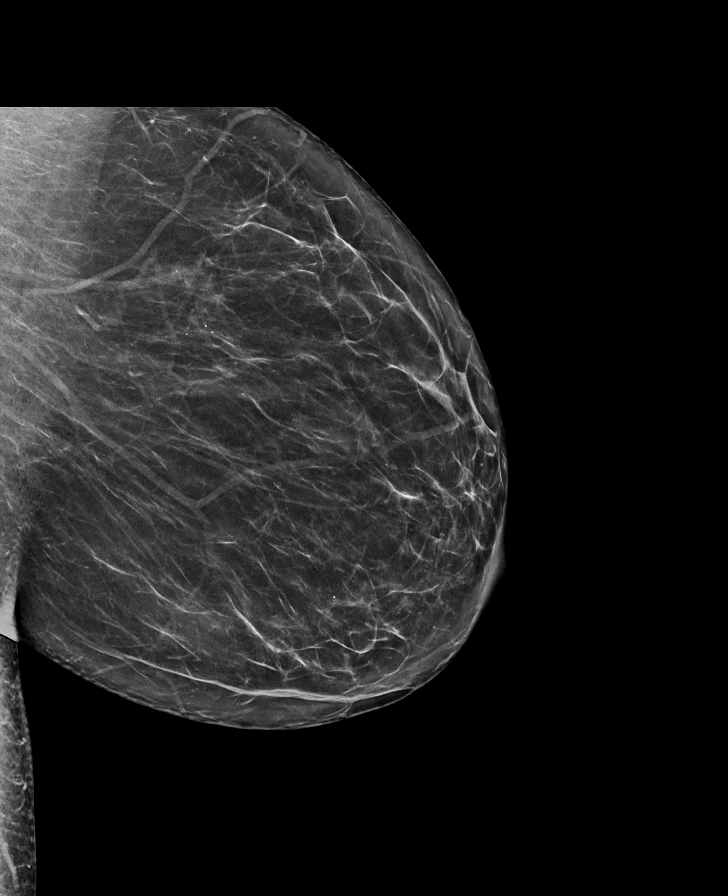

[R CC synth-2D]
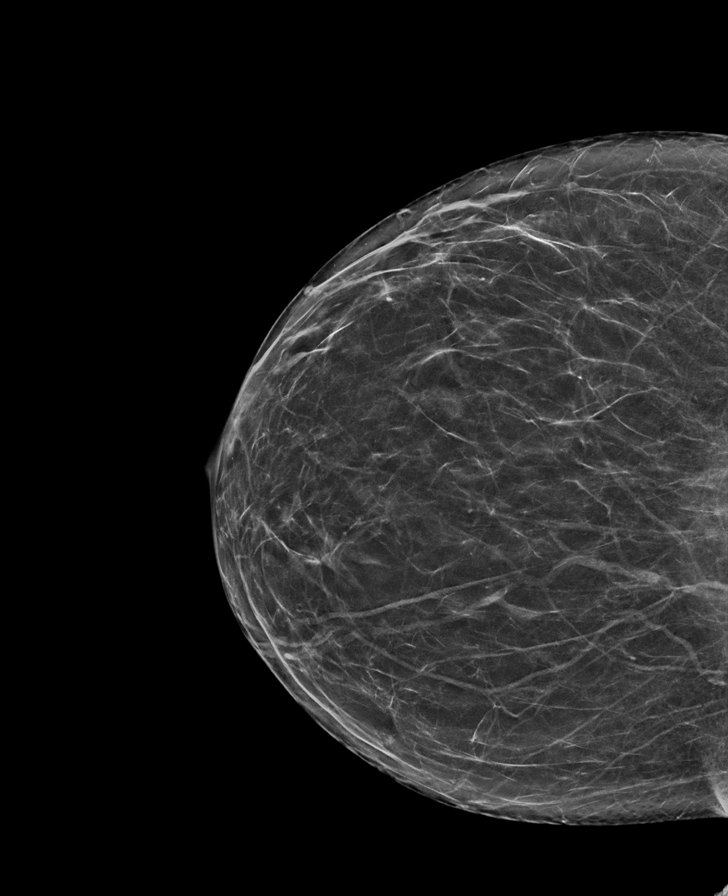

[R MLO tomo · tomo slice 41/80.0]
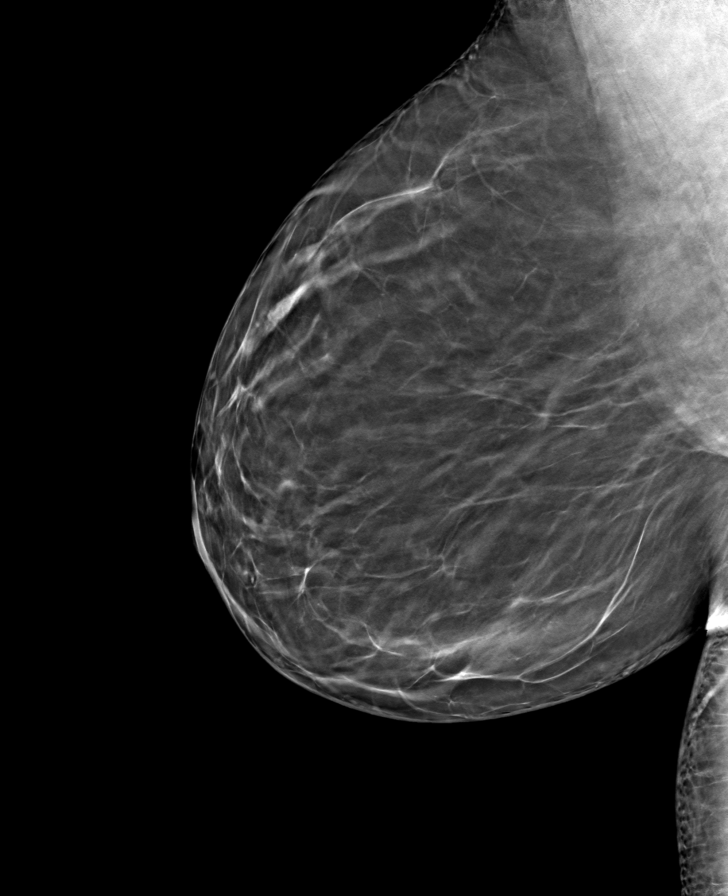

[R CC tomo · tomo slice 35/70.0]
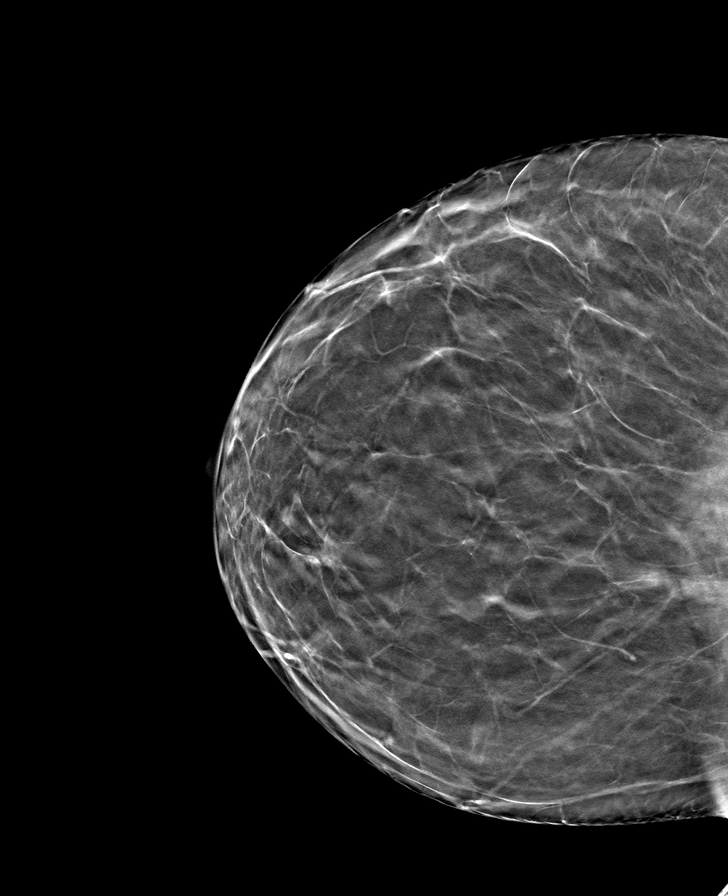

[L MLO tomo · tomo slice 40/79.0]
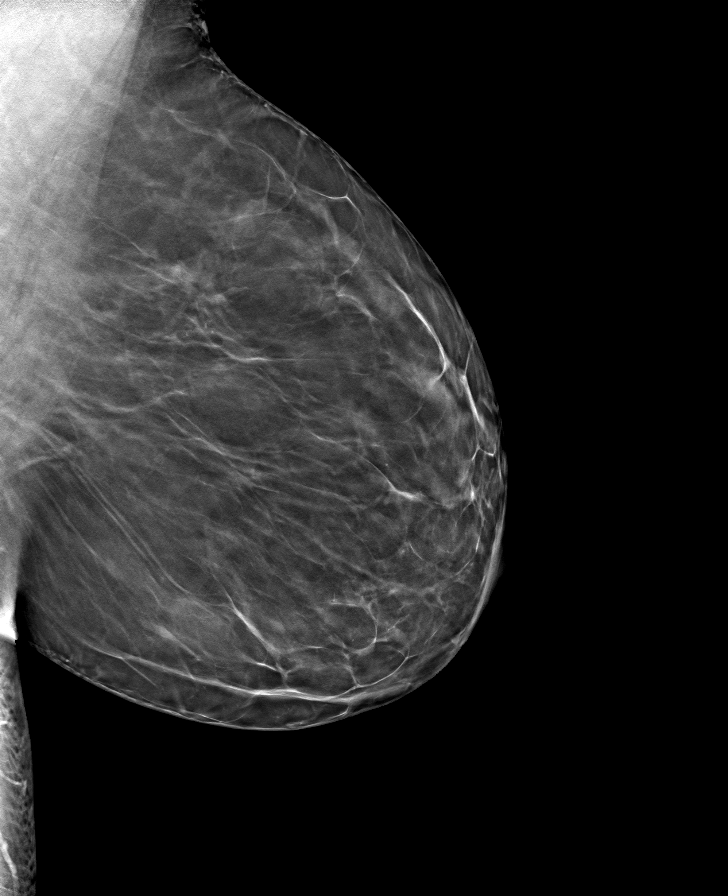

[L CC tomo · tomo slice 37/72.0]
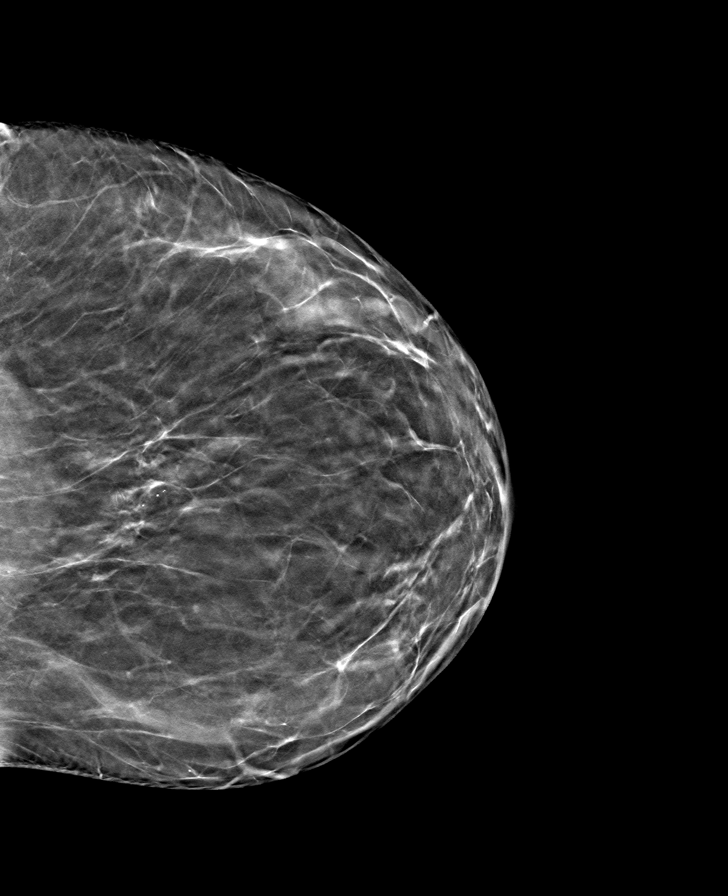

[8 of 24 positions shown; findings below may reference images not displayed]

ACR Breast Density Category b: There are scattered areas of
fibroglandular density.
FINDINGS: There are no findings suspicious for malignancy.
IMPRESSION: No mammographic evidence of malignancy. A result letter of this
screening mammogram will be mailed directly to the patient.

RECOMMENDATION:
Screening mammogram in one year. (Code:51-O-LD2)

BI-RADS CATEGORY  1: Negative.

## 2022-10-28 DIAGNOSIS — H9220 Otorrhagia, unspecified ear: Secondary | ICD-10-CM | POA: Insufficient documentation

## 2023-01-25 ENCOUNTER — Other Ambulatory Visit (INDEPENDENT_AMBULATORY_CARE_PROVIDER_SITE_OTHER): Payer: 59

## 2023-01-25 ENCOUNTER — Ambulatory Visit: Payer: 59 | Admitting: Orthopaedic Surgery

## 2023-01-25 ENCOUNTER — Encounter: Payer: Self-pay | Admitting: Orthopaedic Surgery

## 2023-01-25 VITALS — Ht 61.0 in | Wt 216.0 lb

## 2023-01-25 DIAGNOSIS — M79671 Pain in right foot: Secondary | ICD-10-CM

## 2023-01-25 DIAGNOSIS — R202 Paresthesia of skin: Secondary | ICD-10-CM | POA: Diagnosis not present

## 2023-01-25 NOTE — Progress Notes (Signed)
Office Visit Note   Patient: Tabitha Mcconnell           Date of Birth: 29-Jun-1975           MRN: [id number] Visit Date: 01/25/2023              Requested by: Quitman Livings, MD [address] [place],  [place] [address] PCP: Quitman Livings, MD   Assessment & Plan: Visit Diagnoses:  1. Pain in right foot   2. Paresthesia of hand, bilateral     Plan: Impression is bilateral hand paresthesias likely underlying carpal tunnel syndrome as well as right foot tarsal bossing.  The tarsal bossing could be causing irritation to the extensors resulting in tenosynovitis.  In regards to the hands, we have provided her with night splints.  We have also ordered bilateral upper extremity nerve conduction study/EMG.  She will follow-up once these have been completed.  In regards to the foot, I recommended looser fitting shoes.  I've also recommended topical and oral NSAIDs.  Follow-up as needed for foot.  Call with concerns or questions.  Follow-Up Instructions: Return for f/u after NCS/EMG.   Orders:  Orders Placed This Encounter  Procedures   XR Foot Complete Right   Ambulatory referral to Physical Medicine Rehab   No orders of the defined types were placed in this encounter.     Procedures: No procedures performed   Clinical Data: No additional findings.   Subjective: Chief Complaint  Patient presents with   Right Hand - Pain   Left Hand - Pain   Right Foot - Pain    HPI patient is a pleasant 47 year old right-hand-dominant female who comes in today with bilateral hand paresthesias as well as pain to the dorsum of the right foot.  In regards to her hands, she does type quite a bit for work.  Symptoms have been ongoing for about 1 to 2 years.  She complains more of paresthesias to the index finger but does have symptoms into the entire hand.  Symptoms primarily occur when she is sleeping at night as well as when she is holding her phone.  She has not tried night splinting.  No history  of nerve conduction study or cortisone injections to the carpal tunnel.  No neck pathology.  In regards to the foot, she initially noticed a cyst type formation to the dorsal aspect years ago.  Over the past 6 months she has started noticing pain to that area.  Symptoms occur when she is standing or walking.  She does not seem to notice a difference when she is wearing tight fitting shoes or not.  Review of Systems as detailed in HPI.  All others reviewed and are negative.   Objective: Vital Signs: Ht 5\' 1"  (1.549 m)   Wt 216 lb (98 kg)   BMI 40.81 kg/m   Physical Exam well-developed well-nourished female in no acute distress.  Alert and oriented x 3.  Ortho Exam  Bilateral hand exam: Positive Phalen both sides.  Negative Tinel.  No thenar atrophy.  She is neurovascular intact distally.    Right foot exam: Soft tissue swelling to the dorsal aspect of the midfoot with slight soft tissue crepitus which could indicate tenosynovitis of the extensors.  No actual pain with range of motion.  She is neurovascularly intact distally.  Specialty Comments:  No specialty comments available.  Imaging: XR Foot Complete Right Result Date: 01/25/2023 X-rays of the right foot show very slight  tarsal bossing.  No acute or structural abnormalities.    PMFS History: Patient Active Problem List   Diagnosis Date Noted   Morbid obesity (HCC) 03/02/2013   Past Medical History:  Diagnosis Date   Menorrhagia     Family History  Problem Relation Age of Onset   Parkinson's disease Mother    Heart disease Father    Breast cancer Paternal Aunt        died in 18's   Cancer Paternal Uncle        deceased   Epilepsy Son    Lymphoma Cousin     Past Surgical History:  Procedure Laterality Date   HYSTEROSCOPY WITH D & C N/A 01/15/2017   Procedure: DILATATION AND CURETTAGE /HYSTEROSCOPY;  Surgeon: Genia Del, MD;  Location: [facility];  Service: Gynecology;  Laterality: N/A;    HYSTEROSCOPY WITH NOVASURE N/A 01/15/2017   Procedure: HYSTEROSCOPY WITH NOVASURE;  Surgeon: Genia Del, MD;  Location: [facility];  Service: Gynecology;  Laterality: N/A;  Requests 7:30am OR time.  Requests one hour   LAPAROSCOPIC CHOLECYSTECTOMY  12/2011   RESECTION COMPLEX MYXOID CYST RIGHT WRIST  07-22-2010    dr sypher   [facility]   Social History   Occupational History   Not on file  Tobacco Use   Smoking status: Former    Current packs/day: 0.00    Types: Cigarettes    Start date: 01/14/1999    Quit date: 01/14/2015    Years since quitting: 8.0   Smokeless tobacco: Never  Vaping Use   Vaping status: Never Used  Substance and Sexual Activity   Alcohol use: Yes    Alcohol/week: 0.0 standard drinks of alcohol    Comment: occasional   Drug use: No   Sexual activity: Yes    Partners: Male    Birth control/protection: Condom    Comment: hx ablation, menarche 47yo, sexual debut 47yo

## 2023-02-03 ENCOUNTER — Ambulatory Visit (INDEPENDENT_AMBULATORY_CARE_PROVIDER_SITE_OTHER): Payer: 59 | Admitting: Physical Medicine and Rehabilitation

## 2023-02-03 DIAGNOSIS — M79641 Pain in right hand: Secondary | ICD-10-CM | POA: Diagnosis not present

## 2023-02-03 DIAGNOSIS — R202 Paresthesia of skin: Secondary | ICD-10-CM

## 2023-02-03 DIAGNOSIS — M79601 Pain in right arm: Secondary | ICD-10-CM

## 2023-02-03 DIAGNOSIS — M79642 Pain in left hand: Secondary | ICD-10-CM

## 2023-02-03 NOTE — Progress Notes (Signed)
 Functional Pain Scale - descriptive words and definitions  Mild (2)   Noticeable when not distracted/no impact on ADL's/sleep only slightly affected and able to   use both passive and active distraction for comfort. Mild range order  Average Pain 2  BUE NCS, Numbness and tingling in fingers and wrists. Its worse in the right hand, in the mornings and nights.so difficultly grasping. She is right handed

## 2023-02-04 NOTE — Procedures (Signed)
 EMG & NCV Findings: Evaluation of the left median motor nerve showed prolonged distal onset latency (4.3 ms) and decreased conduction velocity (Elbow-Wrist, 49 m/s).  The right median motor nerve showed prolonged distal onset latency (4.5 ms).  The left median (across palm) sensory and the right median (across palm) sensory nerves showed no response (Palm) and prolonged distal peak latency (L4.7, R4.5 ms).  All remaining nerves (as indicated in the following tables) were within normal limits.  All left vs. right side differences were within normal limits.    All examined muscles (as indicated in the following table) showed no evidence of electrical instability.    Impression: The above electrodiagnostic study is ABNORMAL and reveals evidence of a moderate bilateral median nerve entrapment at the wrist (carpal tunnel syndrome) affecting sensory and motor components.   There is no significant electrodiagnostic evidence of any other focal nerve entrapment, brachial plexopathy or cervical radiculopathy.   Recommendations: 1.  Follow-up with referring physician. 2.  Continue current management of symptoms. 3.  Continue use of resting splint at night-time and as needed during the day.  ___________________________ Tabitha Mcconnell Board Certified, American Board of Physical Medicine and Rehabilitation    Nerve Conduction Studies Anti Sensory Summary Table   Stim Site NR Peak (ms) Norm Peak (ms) P-T Amp (V) Norm P-T Amp Site1 Site2 Delta-P (ms) Dist (cm) Vel (m/s) Norm Vel (m/s)  Left Median Acr Palm Anti Sensory (2nd Digit)  31.5C  Wrist    *4.7 <3.6 25.9 >10 Wrist Palm  0.0    Palm *NR  <2.0          Right Median Acr Palm Anti Sensory (2nd Digit)  30.8C  Wrist    *4.5 <3.6 21.5 >10 Wrist Palm  0.0    Palm *NR  <2.0          Right Radial Anti Sensory (Base 1st Digit)  31.3C  Wrist    2.0 <3.1 45.5  Wrist Base 1st Digit 2.0 0.0    Right Ulnar Anti Sensory (5th Digit)  31.3C  Wrist     3.1 <3.7 25.4 >15.0 Wrist 5th Digit 3.1 14.0 45 >38   Motor Summary Table   Stim Site NR Onset (ms) Norm Onset (ms) O-P Amp (mV) Norm O-P Amp Site1 Site2 Delta-0 (ms) Dist (cm) Vel (m/s) Norm Vel (m/s)  Left Median Motor (Abd Poll Brev)  31.9C  Wrist    *4.3 <4.2 8.9 >5 Elbow Wrist 3.9 19.0 *49 >50  Elbow    8.2  8.5         Right Median Motor (Abd Poll Brev)  31.5C  Wrist    *4.5 <4.2 9.2 >5 Elbow Wrist 3.7 19.0 51 >50  Elbow    8.2  9.1         Right Ulnar Motor (Abd Dig Min)  31.7C  Wrist    2.9 <4.2 7.4 >3 B Elbow Wrist 2.9 19.0 66 >53  B Elbow    5.8  7.4  A Elbow B Elbow 1.3 10.0 77 >53  A Elbow    7.1  6.9          EMG   Side Muscle Nerve Root Ins Act Fibs Psw Amp Dur Poly Recrt Int Bruna Comment  Right Abd Poll Brev Median C8-T1 Nml Nml Nml Nml Nml 0 Nml Nml   Right 1stDorInt Ulnar C8-T1 Nml Nml Nml Nml Nml 0 Nml Nml   Right PronatorTeres Median C6-7 Nml Nml Nml Nml Nml  0 Nml Nml   Right Biceps Musculocut C5-6 Nml Nml Nml Nml Nml 0 Nml Nml   Right Deltoid Axillary C5-6 Nml Nml Nml Nml Nml 0 Nml Nml     Nerve Conduction Studies Anti Sensory Left/Right Comparison   Stim Site L Lat (ms) R Lat (ms) L-R Lat (ms) L Amp (V) R Amp (V) L-R Amp (%) Site1 Site2 L Vel (m/s) R Vel (m/s) L-R Vel (m/s)  Median Acr Palm Anti Sensory (2nd Digit)  31.5C  Wrist *4.7 *4.5 0.2 25.9 21.5 17.0 Wrist Palm     Palm             Radial Anti Sensory (Base 1st Digit)  31.3C  Wrist  2.0   45.5  Wrist Base 1st Digit     Ulnar Anti Sensory (5th Digit)  31.3C  Wrist  3.1   25.4  Wrist 5th Digit  45    Motor Left/Right Comparison   Stim Site L Lat (ms) R Lat (ms) L-R Lat (ms) L Amp (mV) R Amp (mV) L-R Amp (%) Site1 Site2 L Vel (m/s) R Vel (m/s) L-R Vel (m/s)  Median Motor (Abd Poll Brev)  31.9C  Wrist *4.3 *4.5 0.2 8.9 9.2 3.3 Elbow Wrist *49 51 2  Elbow 8.2 8.2 0.0 8.5 9.1 6.6       Ulnar Motor (Abd Dig Min)  31.7C  Wrist  2.9   7.4  B Elbow Wrist  66   B Elbow  5.8   7.4  A Elbow B  Elbow  77   A Elbow  7.1   6.9           Waveforms:

## 2023-02-08 NOTE — Progress Notes (Signed)
 Office Visit Note   Patient: Tabitha Mcconnell           Date of Birth: 01-22-1976           MRN: [id number] Visit Date: 02/09/2023              Requested by: Norval Kettle, MD [address] [place],  [place] [address] PCP: Norval Kettle, MD   Assessment & Plan: Visit Diagnoses:  1. Right carpal tunnel syndrome   2. Left carpal tunnel syndrome     Plan: Impression is moderate bilateral carpal tunnel syndrome.  Her symptoms worse in the morning.  Treatment options were reviewed to include cortisone injection surgical release.  She will think about her options and get back in touch with us .  Follow-Up Instructions: No follow-ups on file.   Orders:  No orders of the defined types were placed in this encounter.  No orders of the defined types were placed in this encounter.     Procedures: No procedures performed   Clinical Data: No additional findings.   Subjective: Chief Complaint  Patient presents with   Left Hand - Follow-up    EMG review   Right Hand - Follow-up    EMG review    HPI Tabitha Mcconnell returns today for EMG review. Review of Systems  Constitutional: Negative.   HENT: Negative.    Eyes: Negative.   Respiratory: Negative.    Cardiovascular: Negative.   Endocrine: Negative.   Musculoskeletal: Negative.   Neurological: Negative.   Hematological: Negative.   Psychiatric/Behavioral: Negative.    All other systems reviewed and are negative.    Objective: Vital Signs: There were no vitals taken for this visit.  Physical Exam Vitals and nursing note reviewed.  Constitutional:      Appearance: She is well-developed.  HENT:     Head: Normocephalic and atraumatic.  Pulmonary:     Effort: Pulmonary effort is normal.  Abdominal:     Palpations: Abdomen is soft.  Musculoskeletal:     Cervical back: Neck supple.  Skin:    General: Skin is warm.     Capillary Refill: Capillary refill takes less than 2 seconds.  Neurological:     Mental Status: She  is alert and oriented to person, place, and time.  Psychiatric:        Behavior: Behavior normal.        Thought Content: Thought content normal.        Judgment: Judgment normal.    Ortho Exam Exams of bilateral hands are unchanged Specialty Comments:  No specialty comments available.  Imaging: No results found.   PMFS History: Patient Active Problem List   Diagnosis Date Noted   Morbid obesity (HCC) 03/02/2013   Past Medical History:  Diagnosis Date   Menorrhagia     Family History  Problem Relation Age of Onset   Parkinson's disease Mother    Heart disease Father    Breast cancer Paternal Aunt        died in 81's   Cancer Paternal Uncle        deceased   Epilepsy Son    Lymphoma Cousin     Past Surgical History:  Procedure Laterality Date   HYSTEROSCOPY WITH D & C N/A 01/15/2017   Procedure: DILATATION AND CURETTAGE /HYSTEROSCOPY;  Surgeon: Lavoie, Marie-Lyne, MD;  Location: [facility];  Service: Gynecology;  Laterality: N/A;   HYSTEROSCOPY WITH NOVASURE N/A 01/15/2017   Procedure: HYSTEROSCOPY WITH NOVASURE;  Surgeon: Lavoie, Marie-Lyne, MD;  Location: [facility];  Service: Gynecology;  Laterality: N/A;  Requests 7:30am OR time.  Requests one hour   LAPAROSCOPIC CHOLECYSTECTOMY  12/2011   RESECTION COMPLEX MYXOID CYST RIGHT WRIST  07-22-2010    dr sypher   [facility]   Social History   Occupational History   Not on file  Tobacco Use   Smoking status: Former    Current packs/day: 0.00    Types: Cigarettes    Start date: 01/14/1999    Quit date: 01/14/2015    Years since quitting: 8.0   Smokeless tobacco: Never  Vaping Use   Vaping status: Never Used  Substance and Sexual Activity   Alcohol use: Yes    Alcohol/week: 0.0 standard drinks of alcohol    Comment: occasional   Drug use: No   Sexual activity: Yes    Partners: Male    Birth control/protection: Condom    Comment: hx ablation, menarche 48yo, sexual debut 48yo

## 2023-02-09 ENCOUNTER — Ambulatory Visit: Payer: 59 | Admitting: Orthopaedic Surgery

## 2023-02-09 ENCOUNTER — Encounter: Payer: Self-pay | Admitting: Orthopaedic Surgery

## 2023-02-09 DIAGNOSIS — G5601 Carpal tunnel syndrome, right upper limb: Secondary | ICD-10-CM

## 2023-02-09 DIAGNOSIS — G5602 Carpal tunnel syndrome, left upper limb: Secondary | ICD-10-CM | POA: Diagnosis not present

## 2023-02-10 ENCOUNTER — Encounter: Payer: Self-pay | Admitting: Physical Medicine and Rehabilitation

## 2023-02-10 NOTE — Progress Notes (Signed)
 Tabitha Mcconnell - 48 y.o. female MRN [id number]  Date of birth: 25-May-1975  Office Visit Note: Visit Date: 02/03/2023 PCP: Norval Kettle, MD Referred by: Jerri Kay CHRISTELLA, MD  Subjective: Chief Complaint  Patient presents with   Left Wrist - Numbness, Tingling   Right Wrist - Numbness, Tingling   HPI: Tabitha Mcconnell is a 48 y.o. female who comes in today at the request of Dr. Ozell Jerri for evaluation and management of chronic, worsening and severe pain, numbness and tingling in the Bilateral upper extremities.  Patient is Right hand dominant.  She reports around 2 years of complaints right more than left hand pain numbness and tingling particularly in the index finger on the right but it can be the whole hand at times but mainly in the radial digits bilaterally.  She does get nocturnal complaints that awaken her with a positive flick sign.  She gets symptoms increased with using the phone and typing at work.  She denies any focal weakness.  She has some neck pain but no frank radicular symptoms down the arms.  She does get some referral up into the forearm and elbow.  She is not diabetic and has no history of thyroid disease.  She has not done any splinting or injections or other treatment other than over-the-counter medications.  No prior electrodiagnostic study of the hands.   I spent more than 30 minutes speaking face-to-face with the patient with 50% of the time in counseling and discussing coordination of care.      Review of Systems  Musculoskeletal:  Positive for joint pain.  Neurological:  Positive for tingling and weakness.  All other systems reviewed and are negative.  Otherwise per HPI.  Assessment & Plan: Visit Diagnoses:    ICD-10-CM   1. Paresthesia of hand, bilateral  R20.2 NCV with EMG (electromyography)    2. Bilateral hand pain  M79.641    M79.642     3. Right arm pain  M79.601        Plan: Impression: The above electrodiagnostic study is ABNORMAL and reveals  evidence of a moderate bilateral median nerve entrapment at the wrist (carpal tunnel syndrome) affecting sensory and motor components.   There is no significant electrodiagnostic evidence of any other focal nerve entrapment, brachial plexopathy or cervical radiculopathy.   Recommendations: 1.  Follow-up with referring physician. 2.  Continue current management of symptoms. 3.  Continue use of resting splint at night-time and as needed during the day.  Consider diagnostic and therapeutic injection.  Meds & Orders: No orders of the defined types were placed in this encounter.   Orders Placed This Encounter  Procedures   NCV with EMG (electromyography)    Follow-up: Return for  Ozell Jerri, MD.   Procedures: No procedures performed  EMG & NCV Findings: Evaluation of the left median motor nerve showed prolonged distal onset latency (4.3 ms) and decreased conduction velocity (Elbow-Wrist, 49 m/s).  The right median motor nerve showed prolonged distal onset latency (4.5 ms).  The left median (across palm) sensory and the right median (across palm) sensory nerves showed no response (Palm) and prolonged distal peak latency (L4.7, R4.5 ms).  All remaining nerves (as indicated in the following tables) were within normal limits.  All left vs. right side differences were within normal limits.    All examined muscles (as indicated in the following table) showed no evidence of electrical instability.    Impression: The above electrodiagnostic study is ABNORMAL and  reveals evidence of a moderate bilateral median nerve entrapment at the wrist (carpal tunnel syndrome) affecting sensory and motor components.   There is no significant electrodiagnostic evidence of any other focal nerve entrapment, brachial plexopathy or cervical radiculopathy.   Recommendations: 1.  Follow-up with referring physician. 2.  Continue current management of symptoms. 3.  Continue use of resting splint at night-time and as  needed during the day.  ___________________________ Prentice Eldonna BETTERS Board Certified, American Board of Physical Medicine and Rehabilitation    Nerve Conduction Studies Anti Sensory Summary Table   Stim Site NR Peak (ms) Norm Peak (ms) P-T Amp (V) Norm P-T Amp Site1 Site2 Delta-P (ms) Dist (cm) Vel (m/s) Norm Vel (m/s)  Left Median Acr Palm Anti Sensory (2nd Digit)  31.5C  Wrist    *4.7 <3.6 25.9 >10 Wrist Palm  0.0    Palm *NR  <2.0          Right Median Acr Palm Anti Sensory (2nd Digit)  30.8C  Wrist    *4.5 <3.6 21.5 >10 Wrist Palm  0.0    Palm *NR  <2.0          Right Radial Anti Sensory (Base 1st Digit)  31.3C  Wrist    2.0 <3.1 45.5  Wrist Base 1st Digit 2.0 0.0    Right Ulnar Anti Sensory (5th Digit)  31.3C  Wrist    3.1 <3.7 25.4 >15.0 Wrist 5th Digit 3.1 14.0 45 >38   Motor Summary Table   Stim Site NR Onset (ms) Norm Onset (ms) O-P Amp (mV) Norm O-P Amp Site1 Site2 Delta-0 (ms) Dist (cm) Vel (m/s) Norm Vel (m/s)  Left Median Motor (Abd Poll Brev)  31.9C  Wrist    *4.3 <4.2 8.9 >5 Elbow Wrist 3.9 19.0 *49 >50  Elbow    8.2  8.5         Right Median Motor (Abd Poll Brev)  31.5C  Wrist    *4.5 <4.2 9.2 >5 Elbow Wrist 3.7 19.0 51 >50  Elbow    8.2  9.1         Right Ulnar Motor (Abd Dig Min)  31.7C  Wrist    2.9 <4.2 7.4 >3 B Elbow Wrist 2.9 19.0 66 >53  B Elbow    5.8  7.4  A Elbow B Elbow 1.3 10.0 77 >53  A Elbow    7.1  6.9          EMG   Side Muscle Nerve Root Ins Act Fibs Psw Amp Dur Poly Recrt Int Bruna Comment  Right Abd Poll Brev Median C8-T1 Nml Nml Nml Nml Nml 0 Nml Nml   Right 1stDorInt Ulnar C8-T1 Nml Nml Nml Nml Nml 0 Nml Nml   Right PronatorTeres Median C6-7 Nml Nml Nml Nml Nml 0 Nml Nml   Right Biceps Musculocut C5-6 Nml Nml Nml Nml Nml 0 Nml Nml   Right Deltoid Axillary C5-6 Nml Nml Nml Nml Nml 0 Nml Nml     Nerve Conduction Studies Anti Sensory Left/Right Comparison   Stim Site L Lat (ms) R Lat (ms) L-R Lat (ms) L Amp (V) R Amp (V)  L-R Amp (%) Site1 Site2 L Vel (m/s) R Vel (m/s) L-R Vel (m/s)  Median Acr Palm Anti Sensory (2nd Digit)  31.5C  Wrist *4.7 *4.5 0.2 25.9 21.5 17.0 Wrist Palm     Palm             Radial Anti Sensory (Base 1st Digit)  31.3C  Wrist  2.0   45.5  Wrist Base 1st Digit     Ulnar Anti Sensory (5th Digit)  31.3C  Wrist  3.1   25.4  Wrist 5th Digit  45    Motor Left/Right Comparison   Stim Site L Lat (ms) R Lat (ms) L-R Lat (ms) L Amp (mV) R Amp (mV) L-R Amp (%) Site1 Site2 L Vel (m/s) R Vel (m/s) L-R Vel (m/s)  Median Motor (Abd Poll Brev)  31.9C  Wrist *4.3 *4.5 0.2 8.9 9.2 3.3 Elbow Wrist *49 51 2  Elbow 8.2 8.2 0.0 8.5 9.1 6.6       Ulnar Motor (Abd Dig Min)  31.7C  Wrist  2.9   7.4  B Elbow Wrist  66   B Elbow  5.8   7.4  A Elbow B Elbow  77   A Elbow  7.1   6.9           Waveforms:                Clinical History: No specialty comments available.   She reports that she quit smoking about 8 years ago. Her smoking use included cigarettes. She started smoking about 24 years ago. She has never used smokeless tobacco. No results for input(s): HGBA1C, LABURIC in the last 8760 hours.  Objective:  VS:  HT:    WT:   BMI:     BP:   HR: bpm  TEMP: ( )  RESP:  Physical Exam Vitals and nursing note reviewed.  Constitutional:      General: She is not in acute distress.    Appearance: Normal appearance. She is well-developed. She is obese. She is not ill-appearing.  HENT:     Head: Normocephalic and atraumatic.  Eyes:     Conjunctiva/sclera: Conjunctivae normal.     Pupils: Pupils are equal, round, and reactive to light.  Cardiovascular:     Rate and Rhythm: Normal rate.     Pulses: Normal pulses.  Pulmonary:     Effort: Pulmonary effort is normal.  Musculoskeletal:        General: No swelling, tenderness or deformity.     Right lower leg: No edema.     Left lower leg: No edema.     Comments: Inspection reveals no atrophy of the bilateral APB or FDI or hand  intrinsics. There is no swelling, color changes, allodynia or dystrophic changes. There is 5 out of 5 strength in the bilateral wrist extension, finger abduction and long finger flexion. There is intact sensation to light touch in all dermatomal and peripheral nerve distributions. There is a negative Froment's test bilaterally. There is a negative Tinel's test at the bilateral wrist and elbow. There is a positive Phalen's test bilaterally. There is a negative Hoffmann's test bilaterally.  Skin:    General: Skin is warm and dry.     Findings: No erythema or rash.  Neurological:     General: No focal deficit present.     Mental Status: She is alert and oriented to person, place, and time.     Sensory: No sensory deficit.     Motor: No weakness or abnormal muscle tone.     Coordination: Coordination normal.     Gait: Gait normal.  Psychiatric:        Mood and Affect: Mood normal.        Behavior: Behavior normal.     Ortho Exam  Imaging: No results found.  Past  Medical/Family/Surgical/Social History: Medications & Allergies reviewed per EMR, new medications updated. Patient Active Problem List   Diagnosis Date Noted   Snoring 02/27/2019   Morbid obesity (HCC) 03/02/2013   Past Medical History:  Diagnosis Date   Menorrhagia    Family History  Problem Relation Age of Onset   Parkinson's disease Mother    Heart disease Father    Breast cancer Paternal Aunt        died in 49's   Cancer Paternal Uncle        deceased   Epilepsy Son    Lymphoma Cousin    Past Surgical History:  Procedure Laterality Date   HYSTEROSCOPY WITH D & C N/A 01/15/2017   Procedure: DILATATION AND CURETTAGE /HYSTEROSCOPY;  Surgeon: Lavoie, Marie-Lyne, MD;  Location: [facility];  Service: Gynecology;  Laterality: N/A;   HYSTEROSCOPY WITH NOVASURE N/A 01/15/2017   Procedure: HYSTEROSCOPY WITH NOVASURE;  Surgeon: Lavoie, Marie-Lyne, MD;  Location: [facility];  Service:  Gynecology;  Laterality: N/A;  Requests 7:30am OR time.  Requests one hour   LAPAROSCOPIC CHOLECYSTECTOMY  12/2011   RESECTION COMPLEX MYXOID CYST RIGHT WRIST  07-22-2010    dr sypher   [facility]   Social History   Occupational History   Not on file  Tobacco Use   Smoking status: Former    Current packs/day: 0.00    Types: Cigarettes    Start date: 01/14/1999    Quit date: 01/14/2015    Years since quitting: 8.0   Smokeless tobacco: Never  Vaping Use   Vaping status: Never Used  Substance and Sexual Activity   Alcohol use: Yes    Alcohol/week: 0.0 standard drinks of alcohol    Comment: occasional   Drug use: No   Sexual activity: Yes    Partners: Male    Birth control/protection: Condom    Comment: hx ablation, menarche 48yo, sexual debut 48yo

## 2023-03-29 ENCOUNTER — Ambulatory Visit (INDEPENDENT_AMBULATORY_CARE_PROVIDER_SITE_OTHER): Payer: 59 | Admitting: Family Medicine

## 2023-03-29 ENCOUNTER — Encounter: Payer: Self-pay | Admitting: Family Medicine

## 2023-03-29 VITALS — BP 164/109 | HR 93 | Temp 98.6°F | Resp 16 | Ht 62.0 in | Wt 221.4 lb

## 2023-03-29 DIAGNOSIS — I10 Essential (primary) hypertension: Secondary | ICD-10-CM | POA: Diagnosis not present

## 2023-03-29 DIAGNOSIS — Z7689 Persons encountering health services in other specified circumstances: Secondary | ICD-10-CM

## 2023-03-29 DIAGNOSIS — E66813 Obesity, class 3: Secondary | ICD-10-CM

## 2023-03-29 DIAGNOSIS — Z6841 Body Mass Index (BMI) 40.0 and over, adult: Secondary | ICD-10-CM

## 2023-03-29 MED ORDER — WEGOVY 0.25 MG/0.5ML ~~LOC~~ SOAJ: 0.2500 mg | SUBCUTANEOUS | 0 refills | Status: DC

## 2023-03-29 MED ORDER — LISINOPRIL 10 MG PO TABS: 10.0000 mg | ORAL_TABLET | Freq: Every day | ORAL | 0 refills | Status: DC

## 2023-03-29 NOTE — Progress Notes (Signed)
 New Patient Office Visit  Subjective    Patient ID: Tabitha Mcconnell, female    DOB: [birth year]  Age: 48 y.o. MRN: [id number]  CC:  Chief Complaint  Patient presents with   Establish Care    HPI Tabitha Mcconnell presents to establish care and for follow up of chronic med issues including hypertension. Patient reports med compliance and denies acute complaints.    Outpatient Encounter Medications as of 03/29/2023  Medication Sig   lisinopril (ZESTRIL) 10 MG tablet Take 1 tablet (10 mg total) by mouth daily.   Semaglutide-Weight Management (WEGOVY) 0.25 MG/0.5ML SOAJ Inject 0.25 mg into the skin once a week.   No facility-administered encounter medications on file as of 03/29/2023.    Past Medical History:  Diagnosis Date   Menorrhagia     Past Surgical History:  Procedure Laterality Date   HYSTEROSCOPY WITH D & C N/A 01/15/2017   Procedure: DILATATION AND CURETTAGE /HYSTEROSCOPY;  Surgeon: Genia Del, MD;  Location: [facility];  Service: Gynecology;  Laterality: N/A;   HYSTEROSCOPY WITH NOVASURE N/A 01/15/2017   Procedure: HYSTEROSCOPY WITH NOVASURE;  Surgeon: Genia Del, MD;  Location: [facility] ;  Service: Gynecology;  Laterality: N/A;  Requests 7:30am OR time.  Requests one hour   LAPAROSCOPIC CHOLECYSTECTOMY  12/2011   RESECTION COMPLEX MYXOID CYST RIGHT WRIST  07-22-2010    dr sypher   [facility]    Family History  Problem Relation Age of Onset   Parkinson's disease Mother    Heart disease Father    Breast cancer Paternal Aunt        died in 16's   Cancer Paternal Uncle        deceased   Epilepsy Son    Lymphoma Cousin     Social History   Socioeconomic History   Marital status: Single    Spouse name: Not on file   Number of children: Not on file   Years of education: Not on file   Highest education level: 12th grade  Occupational History   Not on file  Tobacco Use   Smoking status: Former    Current packs/day:  0.00    Types: Cigarettes    Start date: 01/14/1999    Quit date: 01/14/2015    Years since quitting: 8.2   Smokeless tobacco: Never  Vaping Use   Vaping status: Never Used  Substance and Sexual Activity   Alcohol use: Yes    Alcohol/week: 0.0 standard drinks of alcohol    Comment: occasional   Drug use: No   Sexual activity: Yes    Partners: Male    Birth control/protection: Condom    Comment: hx ablation, menarche 48yo, sexual debut 48yo  Other Topics Concern   Not on file  Social History Narrative   Not on file   Social Drivers of Health   Financial Resource Strain: Medium Risk (03/28/2023)   Overall Financial Resource Strain (CARDIA)    Difficulty of Paying Living Expenses: Somewhat hard  Food Insecurity: No Food Insecurity (03/28/2023)   Hunger Vital Sign    Worried About Running Out of Food in the Last Year: Never true    Ran Out of Food in the Last Year: Never true  Transportation Needs: No Transportation Needs (03/28/2023)   PRAPARE - Administrator, Civil Service (Medical): No    Lack of Transportation (Non-Medical): No  Physical Activity: Insufficiently Active (03/28/2023)   Exercise Vital Sign    Days  of Exercise per Week: 3 days    Minutes of Exercise per Session: 10 min  Stress: Stress Concern Present (03/28/2023)   [employer] of Occupational Health - Occupational Stress Questionnaire    Feeling of Stress : To some extent  Social Connections: Moderately Isolated (03/28/2023)   Social Connection and Isolation Panel [NHANES]    Frequency of Communication with Friends and Family: Twice a week    Frequency of Social Gatherings with Friends and Family: Once a week    Attends Religious Services: More than 4 times per year    Active Member of [employer] or Organizations: No    Attends Engineer, structural: Not on file    Marital Status: Never married  Intimate Partner Violence: Not on file    Review of Systems  All other systems reviewed and are  negative.       Objective   BP (!) 164/109   Pulse 93   Temp 98.6 F (37 C) (Oral)   Resp 16   Ht 5\' 2"  (1.575 m)   Wt 221 lb 6.4 oz (100.4 kg)   SpO2 96%   BMI 40.49 kg/m   Physical Exam Vitals and nursing note reviewed.  Constitutional:      General: She is not in acute distress. Cardiovascular:     Rate and Rhythm: Normal rate and regular rhythm.  Pulmonary:     Effort: Pulmonary effort is normal.     Breath sounds: Normal breath sounds.  Abdominal:     Palpations: Abdomen is soft.     Tenderness: There is no abdominal tenderness.  Neurological:     General: No focal deficit present.     Mental Status: She is alert and oriented to person, place, and time.         Assessment & Plan:   Uncontrolled hypertension  Encounter for weight management  Class 3 severe obesity due to excess calories with serious comorbidity and body mass index (BMI) of 40.0 to 44.9 in adult [facility])  Other orders -     Lisinopril; Take 1 tablet (10 mg total) by mouth daily.  Dispense: 90 tablet; Refill: 0 -     Wegovy; Inject 0.25 mg into the skin once a week.  Dispense: 2 mL; Refill: 0     Return in about 6 weeks (around 05/10/2023) for follow up.   Tommie Raymond, MD

## 2023-04-01 ENCOUNTER — Encounter: Payer: Self-pay | Admitting: Family Medicine

## 2023-04-07 ENCOUNTER — Other Ambulatory Visit: Payer: Self-pay

## 2023-04-12 ENCOUNTER — Telehealth: Payer: Self-pay

## 2023-04-12 NOTE — Telephone Encounter (Signed)
 Here is the last for today sorry about this Tresa Endo.  Wegovyt 0.25MG /0.5ML Auto-injectors

## 2023-04-13 ENCOUNTER — Other Ambulatory Visit: Payer: Self-pay

## 2023-04-23 ENCOUNTER — Other Ambulatory Visit: Payer: Self-pay | Admitting: Family Medicine

## 2023-04-23 MED ORDER — TIRZEPATIDE-WEIGHT MANAGEMENT 2.5 MG/0.5ML ~~LOC~~ SOLN: 2.5000 mg | SUBCUTANEOUS | 0 refills | Status: DC

## 2023-04-28 ENCOUNTER — Other Ambulatory Visit: Payer: Self-pay

## 2023-05-07 ENCOUNTER — Other Ambulatory Visit: Payer: Self-pay

## 2023-05-11 ENCOUNTER — Ambulatory Visit (INDEPENDENT_AMBULATORY_CARE_PROVIDER_SITE_OTHER): Admitting: Family Medicine

## 2023-05-11 VITALS — BP 155/105 | HR 96 | Temp 98.1°F | Resp 16 | Ht 62.0 in | Wt 215.0 lb

## 2023-05-11 DIAGNOSIS — Z6839 Body mass index (BMI) 39.0-39.9, adult: Secondary | ICD-10-CM | POA: Diagnosis not present

## 2023-05-11 DIAGNOSIS — E66812 Obesity, class 2: Secondary | ICD-10-CM | POA: Diagnosis not present

## 2023-05-11 DIAGNOSIS — I10 Essential (primary) hypertension: Secondary | ICD-10-CM | POA: Diagnosis not present

## 2023-05-11 NOTE — Progress Notes (Unsigned)
Patient is here for their  follow-up Patient has no concerns today Care gaps have been discussed with patient  

## 2023-05-12 ENCOUNTER — Encounter: Payer: Self-pay | Admitting: Family Medicine

## 2023-05-12 NOTE — Progress Notes (Signed)
 Established Patient Office Visit  Subjective    Patient ID: SHATORA WEATHERBEE, female    DOB: [birth year]  Age: 48 y.o. MRN: [id number]  CC:  Chief Complaint  Patient presents with   Medical Management of Chronic Issues    HPI Tabitha Mcconnell presents for follow up of hypertension. Patient reports that she had not started taking the med as of yet and denies acute complaints.   Outpatient Encounter Medications as of 05/11/2023  Medication Sig   lisinopril (ZESTRIL) 10 MG tablet Take 1 tablet (10 mg total) by mouth daily.   Semaglutide-Weight Management (WEGOVY) 0.25 MG/0.5ML SOAJ Inject 0.25 mg into the skin once a week.   tirzepatide (ZEPBOUND) 2.5 MG/0.5ML injection vial Inject 2.5 mg into the skin once a week. (Patient not taking: Reported on 05/11/2023)   No facility-administered encounter medications on file as of 05/11/2023.    Past Medical History:  Diagnosis Date   Menorrhagia     Past Surgical History:  Procedure Laterality Date   HYSTEROSCOPY WITH D & C N/A 01/15/2017   Procedure: DILATATION AND CURETTAGE /HYSTEROSCOPY;  Surgeon: Genia Del, MD;  Location: [facility];  Service: Gynecology;  Laterality: N/A;   HYSTEROSCOPY WITH NOVASURE N/A 01/15/2017   Procedure: HYSTEROSCOPY WITH NOVASURE;  Surgeon: Genia Del, MD;  Location: [facility];  Service: Gynecology;  Laterality: N/A;  Requests 7:30am OR time.  Requests one hour   LAPAROSCOPIC CHOLECYSTECTOMY  12/2011   RESECTION COMPLEX MYXOID CYST RIGHT WRIST  07-22-2010    dr sypher   [facility]    Family History  Problem Relation Age of Onset   Parkinson's disease Mother    Heart disease Father    Breast cancer Paternal Aunt        died in 65's   Cancer Paternal Uncle        deceased   Epilepsy Son    Lymphoma Cousin     Social History   Socioeconomic History   Marital status: Single    Spouse name: Not on file   Number of children: Not on file   Years of education:  Not on file   Highest education level: 12th grade  Occupational History   Not on file  Tobacco Use   Smoking status: Former    Current packs/day: 0.00    Types: Cigarettes    Start date: 01/14/1999    Quit date: 01/14/2015    Years since quitting: 8.3   Smokeless tobacco: Never  Vaping Use   Vaping status: Never Used  Substance and Sexual Activity   Alcohol use: Yes    Alcohol/week: 0.0 standard drinks of alcohol    Comment: occasional   Drug use: No   Sexual activity: Yes    Partners: Male    Birth control/protection: Condom    Comment: hx ablation, menarche 48yo, sexual debut 48yo  Other Topics Concern   Not on file  Social History Narrative   Not on file   Social Drivers of Health   Financial Resource Strain: Medium Risk (03/28/2023)   Overall Financial Resource Strain (CARDIA)    Difficulty of Paying Living Expenses: Somewhat hard  Food Insecurity: No Food Insecurity (03/28/2023)   Hunger Vital Sign    Worried About Running Out of Food in the Last Year: Never true    Ran Out of Food in the Last Year: Never true  Transportation Needs: No Transportation Needs (03/28/2023)   PRAPARE - Transportation    Lack of  Transportation (Medical): No    Lack of Transportation (Non-Medical): No  Physical Activity: Insufficiently Active (03/28/2023)   Exercise Vital Sign    Days of Exercise per Week: 3 days    Minutes of Exercise per Session: 10 min  Stress: Stress Concern Present (03/28/2023)   [employer] of Occupational Health - Occupational Stress Questionnaire    Feeling of Stress : To some extent  Social Connections: Moderately Isolated (03/28/2023)   Social Connection and Isolation Panel [NHANES]    Frequency of Communication with Friends and Family: Twice a week    Frequency of Social Gatherings with Friends and Family: Once a week    Attends Religious Services: More than 4 times per year    Active Member of [employer] or Organizations: No    Attends Hospital doctor: Not on file    Marital Status: Never married  Intimate Partner Violence: Not on file    Review of Systems  All other systems reviewed and are negative.       Objective    BP (!) 155/105   Pulse 96   Temp 98.1 F (36.7 C) (Oral)   Resp 16   Ht 5\' 2"  (1.575 m)   Wt 215 lb (97.5 kg)   SpO2 96%   BMI 39.32 kg/m   Physical Exam Vitals and nursing note reviewed.  Constitutional:      General: She is not in acute distress.    Appearance: She is obese.  Cardiovascular:     Rate and Rhythm: Normal rate and regular rhythm.  Pulmonary:     Effort: Pulmonary effort is normal.     Breath sounds: Normal breath sounds.  Abdominal:     Palpations: Abdomen is soft.     Tenderness: There is no abdominal tenderness.  Neurological:     General: No focal deficit present.     Mental Status: She is alert and oriented to person, place, and time.         Assessment & Plan:   Uncontrolled hypertension  Class 2 severe obesity due to excess calories with serious comorbidity and body mass index (BMI) of 39.0 to 39.9 in adult [facility])     Return in about 4 weeks (around 06/08/2023) for follow up.   Arlo Lama, MD

## 2023-05-13 ENCOUNTER — Other Ambulatory Visit: Payer: Self-pay | Admitting: Family Medicine

## 2023-05-14 NOTE — Telephone Encounter (Signed)
 Requested Prescriptions  Pending Prescriptions Disp Refills   Semaglutide -Weight Management (WEGOVY ) 0.25 MG/0.5ML SOAJ [Pharmacy Med Name: WEGOVY  0.25 MG/0.5 ML PEN] 2 mL 0    Sig: INJECT 0.25MG  INTO THE SKIN ONE TIME PER WEEK     Endocrinology:  Diabetes - GLP-1 Receptor Agonists - semaglutide  Failed - 05/14/2023 11:52 AM      Failed - HBA1C in normal range and within 180 days    Hgb A1c MFr Bld  Date Value Ref Range Status  04/19/2019 5.6 <5.7 % of total Hgb Final    Comment:    For the purpose of screening for the presence of diabetes: . <5.7%       Consistent with the absence of diabetes 5.7-6.4%    Consistent with increased risk for diabetes             (prediabetes) > or =6.5%  Consistent with diabetes . This assay result is consistent with a decreased risk of diabetes. . Currently, no consensus exists regarding use of hemoglobin A1c for diagnosis of diabetes in children. . According to American Diabetes Association (ADA) guidelines, hemoglobin A1c <7.0% represents optimal control in non-pregnant diabetic patients. Different metrics may apply to specific patient populations.  Standards of Medical Care in Diabetes(ADA). .          Passed - Cr in normal range and within 360 days    Creat  Date Value Ref Range Status  06/18/2022 0.77 0.50 - 0.99 mg/dL Final         Passed - Valid encounter within last 6 months    Recent Outpatient Visits           3 days ago Uncontrolled hypertension   [place] [facility], MD   1 month ago Uncontrolled hypertension   [facility] [facility], MD

## 2023-05-26 ENCOUNTER — Other Ambulatory Visit: Payer: Self-pay | Admitting: Nurse Practitioner

## 2023-05-26 DIAGNOSIS — Z1231 Encounter for screening mammogram for malignant neoplasm of breast: Secondary | ICD-10-CM

## 2023-05-27 ENCOUNTER — Ambulatory Visit
Admission: RE | Admit: 2023-05-27 | Discharge: 2023-05-27 | Disposition: A | Discharge status: Home / Self Care | Source: Ambulatory Visit | Attending: Nurse Practitioner | Admitting: Nurse Practitioner

## 2023-05-27 DIAGNOSIS — Z1231 Encounter for screening mammogram for malignant neoplasm of breast: Secondary | ICD-10-CM

## 2023-06-08 ENCOUNTER — Other Ambulatory Visit: Payer: Self-pay | Admitting: Family Medicine

## 2023-06-08 ENCOUNTER — Ambulatory Visit: Admitting: Family Medicine

## 2023-06-09 NOTE — Telephone Encounter (Unsigned)
 Copied from CRM [id number]. Topic: Clinical - Prescription Issue >> Jun 09, 2023  3:17 PM Kevelyn M wrote: Reason for CRM: Patient is calling in because she received a few messages that stated her prescription was denied, Semaglutide -Weight Management (WEGOVY ) 0.25 MG/0.5ML [name] [[id number]] . Patient is due to see Dr. Elvan Hamel on May 20th. She is out of this med. She was wanting to see if it could be refilled since she has an appointment on the 20th.

## 2023-06-09 NOTE — Telephone Encounter (Signed)
 Requested medication (s) are due for refill today -yes  Requested medication (s) are on the active medication list -yes  Future visit scheduled -yes  Last refill: 05/14/23 2ml  Notes to clinic: Rx denied by provider- second request:  Copied from CRM [id number]. Topic: Clinical - Prescription Issue >> Jun 09, 2023  3:17 PM Tabitha Mcconnell wrote: Reason for CRM: Patient is calling in because she received a few messages that stated her prescription was denied, Semaglutide -Weight Management (WEGOVY ) 0.25 MG/0.5ML [name] [[id number]] . Patient is due to see Dr. Elvan Hamel on May 20th. She is out of this med. She was wanting to see if it could be refilled since she has an appointment on the 20th.      Requested Prescriptions  Pending Prescriptions Disp Refills   Semaglutide -Weight Management (WEGOVY ) 0.25 MG/0.5ML SOAJ 2 mL 0     Endocrinology:  Diabetes - GLP-1 Receptor Agonists - semaglutide  Failed - 06/09/2023  4:29 PM      Failed - HBA1C in normal range and within 180 days    Hgb A1c MFr Bld  Date Value Ref Range Status  04/19/2019 5.6 <5.7 % of total Hgb Final    Comment:    For the purpose of screening for the presence of diabetes: . <5.7%       Consistent with the absence of diabetes 5.7-6.4%    Consistent with increased risk for diabetes             (prediabetes) > or =6.5%  Consistent with diabetes . This assay result is consistent with a decreased risk of diabetes. . Currently, no consensus exists regarding use of hemoglobin A1c for diagnosis of diabetes in children. . According to American Diabetes Association (ADA) guidelines, hemoglobin A1c <7.0% represents optimal control in non-pregnant diabetic patients. Different metrics may apply to specific patient populations.  Standards of Medical Care in Diabetes(ADA). .          Passed - Cr in normal range and within 360 days    Creat  Date Value Ref Range Status  06/18/2022 0.77 0.50 - 0.99 mg/dL Final         Passed - Valid  encounter within last 6 months    Recent Outpatient Visits           4 weeks ago Uncontrolled hypertension   [place] [facility], MD   2 months ago Uncontrolled hypertension   [place] [facility], [place]              Refused Prescriptions Disp Refills   WEGOVY  0.25 MG/0.5ML Camie Cease Med Name: WEGOVY  0.25 MG/0.5 ML PEN]      Sig: INJECT 0.25 MG SUBCUTANEOUSLY ONE TIME PER WEEK     Endocrinology:  Diabetes - GLP-1 Receptor Agonists - semaglutide  Failed - 06/09/2023  4:29 PM      Failed - HBA1C in normal range and within 180 days    Hgb A1c MFr Bld  Date Value Ref Range Status  04/19/2019 5.6 <5.7 % of total Hgb Final    Comment:    For the purpose of screening for the presence of diabetes: . <5.7%       Consistent with the absence of diabetes 5.7-6.4%    Consistent with increased risk for diabetes             (prediabetes) > or =6.5%  Consistent with diabetes . This assay result is consistent with a decreased  risk of diabetes. . Currently, no consensus exists regarding use of hemoglobin A1c for diagnosis of diabetes in children. . According to American Diabetes Association (ADA) guidelines, hemoglobin A1c <7.0% represents optimal control in non-pregnant diabetic patients. Different metrics may apply to specific patient populations.  Standards of Medical Care in Diabetes(ADA). .          Passed - Cr in normal range and within 360 days    Creat  Date Value Ref Range Status  06/18/2022 0.77 0.50 - 0.99 mg/dL Final         Passed - Valid encounter within last 6 months    Recent Outpatient Visits           4 weeks ago Uncontrolled hypertension   [place] [facility], MD   2 months ago Uncontrolled hypertension   [place] [facility], MD                 Requested Prescriptions  Pending Prescriptions  Disp Refills   Semaglutide -Weight Management (WEGOVY ) 0.25 MG/0.5ML SOAJ 2 mL 0     Endocrinology:  Diabetes - GLP-1 Receptor Agonists - semaglutide  Failed - 06/09/2023  4:29 PM      Failed - HBA1C in normal range and within 180 days    Hgb A1c MFr Bld  Date Value Ref Range Status  04/19/2019 5.6 <5.7 % of total Hgb Final    Comment:    For the purpose of screening for the presence of diabetes: . <5.7%       Consistent with the absence of diabetes 5.7-6.4%    Consistent with increased risk for diabetes             (prediabetes) > or =6.5%  Consistent with diabetes . This assay result is consistent with a decreased risk of diabetes. . Currently, no consensus exists regarding use of hemoglobin A1c for diagnosis of diabetes in children. . According to American Diabetes Association (ADA) guidelines, hemoglobin A1c <7.0% represents optimal control in non-pregnant diabetic patients. Different metrics may apply to specific patient populations.  Standards of Medical Care in Diabetes(ADA). .          Passed - Cr in normal range and within 360 days    Creat  Date Value Ref Range Status  06/18/2022 0.77 0.50 - 0.99 mg/dL Final         Passed - Valid encounter within last 6 months    Recent Outpatient Visits           4 weeks ago Uncontrolled hypertension   [place] [facility], MD   2 months ago Uncontrolled hypertension   [place] [facility], MD              Refused Prescriptions Disp Refills   WEGOVY  0.25 MG/0.5ML Camie Cease Med Name: WEGOVY  0.25 MG/0.5 ML PEN]      Sig: INJECT 0.25 MG SUBCUTANEOUSLY ONE TIME PER WEEK     Endocrinology:  Diabetes - GLP-1 Receptor Agonists - semaglutide  Failed - 06/09/2023  4:29 PM      Failed - HBA1C in normal range and within 180 days    Hgb A1c MFr Bld  Date Value Ref Range Status  04/19/2019 5.6 <5.7 % of total Hgb Final    Comment:    For the  purpose of screening for the presence of diabetes: . <5.7%  Consistent with the absence of diabetes 5.7-6.4%    Consistent with increased risk for diabetes             (prediabetes) > or =6.5%  Consistent with diabetes . This assay result is consistent with a decreased risk of diabetes. . Currently, no consensus exists regarding use of hemoglobin A1c for diagnosis of diabetes in children. . According to American Diabetes Association (ADA) guidelines, hemoglobin A1c <7.0% represents optimal control in non-pregnant diabetic patients. Different metrics may apply to specific patient populations.  Standards of Medical Care in Diabetes(ADA). .          Passed - Cr in normal range and within 360 days    Creat  Date Value Ref Range Status  06/18/2022 0.77 0.50 - 0.99 mg/dL Final         Passed - Valid encounter within last 6 months    Recent Outpatient Visits           4 weeks ago Uncontrolled hypertension   [place] [facility], MD   2 months ago Uncontrolled hypertension   [place] [facility], MD

## 2023-06-10 NOTE — Addendum Note (Signed)
 Addended by: Theotis Flake F on: 06/10/2023 09:39 AM   Modules accepted: Orders

## 2023-06-15 ENCOUNTER — Ambulatory Visit (INDEPENDENT_AMBULATORY_CARE_PROVIDER_SITE_OTHER): Admitting: Family Medicine

## 2023-06-15 ENCOUNTER — Encounter: Payer: Self-pay | Admitting: Family Medicine

## 2023-06-15 VITALS — BP 137/95 | HR 82 | Temp 98.1°F | Resp 16 | Wt 216.0 lb

## 2023-06-15 DIAGNOSIS — E66812 Obesity, class 2: Secondary | ICD-10-CM | POA: Diagnosis not present

## 2023-06-15 DIAGNOSIS — Z6839 Body mass index (BMI) 39.0-39.9, adult: Secondary | ICD-10-CM | POA: Diagnosis not present

## 2023-06-15 DIAGNOSIS — I10 Essential (primary) hypertension: Secondary | ICD-10-CM | POA: Diagnosis not present

## 2023-06-15 MED ORDER — HYDROCHLOROTHIAZIDE 12.5 MG PO CAPS: 12.5000 mg | ORAL_CAPSULE | Freq: Every day | ORAL | 0 refills | Status: DC

## 2023-06-15 MED ORDER — WEGOVY 0.5 MG/0.5ML ~~LOC~~ SOAJ: 0.5000 mg | SUBCUTANEOUS | 0 refills | Status: DC

## 2023-06-15 NOTE — Progress Notes (Signed)
Patient is here for follow-up BP Patent has uncontrolled hypertension Provider is  aware of readings

## 2023-06-16 ENCOUNTER — Encounter: Payer: Self-pay | Admitting: Family Medicine

## 2023-06-16 NOTE — Progress Notes (Signed)
 Established Patient Office Visit  Subjective    Patient ID: Tabitha Mcconnell, female    DOB: December 17, 1975  Age: 48 y.o. MRN: [id number]  CC:  Chief Complaint  Patient presents with   Medical Management of Chronic Issues    HPI Tabitha Mcconnell presents for follow up of hypertension. Patient reports med compliance and denies acute complaints.   Outpatient Encounter Medications as of 06/15/2023  Medication Sig   hydrochlorothiazide (MICROZIDE) 12.5 MG capsule Take 1 capsule (12.5 mg total) by mouth daily.   lisinopril  (ZESTRIL ) 10 MG tablet Take 1 tablet (10 mg total) by mouth daily.   Semaglutide -Weight Management (WEGOVY ) 0.25 MG/0.5ML SOAJ INJECT 0.25MG  INTO THE SKIN ONE TIME PER WEEK   Semaglutide -Weight Management (WEGOVY ) 0.5 MG/0.5ML SOAJ Inject 0.5 mg into the skin once a week.   [DISCONTINUED] tirzepatide (ZEPBOUND) 2.5 MG/0.5ML injection vial Inject 2.5 mg into the skin once a week. (Patient not taking: Reported on 05/11/2023)   No facility-administered encounter medications on file as of 06/15/2023.    Past Medical History:  Diagnosis Date   Menorrhagia     Past Surgical History:  Procedure Laterality Date   HYSTEROSCOPY WITH D & C N/A 01/15/2017   Procedure: DILATATION AND CURETTAGE /HYSTEROSCOPY;  Surgeon: Tabitha Mcconnell, Marie-Lyne, MD;  Location: [facility];  Service: Gynecology;  Laterality: N/A;   HYSTEROSCOPY WITH NOVASURE N/A 01/15/2017   Procedure: HYSTEROSCOPY WITH NOVASURE;  Surgeon: Tabitha Mcconnell, Marie-Lyne, MD;  Location: [facility];  Service: Gynecology;  Laterality: N/A;  Requests 7:30am OR time.  Requests one hour   LAPAROSCOPIC CHOLECYSTECTOMY  12/2011   RESECTION COMPLEX MYXOID CYST RIGHT WRIST  07-22-2010    Tabitha Mcconnell   Tabitha Mcconnell    Family History  Problem Relation Age of Onset   Parkinson's disease Mother    Heart disease Father    Breast cancer Paternal Aunt        died in 70's   Cancer Paternal Uncle        deceased   Epilepsy Son     Lymphoma Cousin     Social History   Socioeconomic History   Marital status: Single    Spouse name: Not on file   Number of children: Not on file   Years of education: Not on file   Highest education level: 12th grade  Occupational History   Not on file  Tobacco Use   Smoking status: Former    Current packs/day: 0.00    Types: Cigarettes    Start date: 01/14/1999    Quit date: 01/14/2015    Years since quitting: 8.4   Smokeless tobacco: Never  Vaping Use   Vaping status: Never Used  Substance and Sexual Activity   Alcohol use: Yes    Alcohol/week: 0.0 standard drinks of alcohol    Comment: occasional   Drug use: No   Sexual activity: Yes    Partners: Male    Birth control/protection: Condom    Comment: hx ablation, menarche 48yo, sexual debut 48yo  Other Topics Concern   Not on file  Social History Narrative   Not on file   Social Drivers of Health   Mcconnell Resource Strain: Medium Risk (03/28/2023)   Overall Mcconnell Resource Strain (CARDIA)    Difficulty of Paying Living Expenses: Somewhat hard  Food Insecurity: No Food Insecurity (03/28/2023)   Hunger Vital Sign    Worried About Running Out of Food in the Last Year: Never true    Ran Out  of Food in the Last Year: Never true  Transportation Needs: No Transportation Needs (03/28/2023)   PRAPARE - Administrator, Civil Service (Medical): No    Lack of Transportation (Non-Medical): No  Physical Activity: Insufficiently Active (03/28/2023)   Exercise Vital Sign    Days of Exercise per Week: 3 days    Minutes of Exercise per Session: 10 min  Stress: Stress Concern Present (03/28/2023)   [employer] of Occupational Health - Occupational Stress Questionnaire    Feeling of Stress : To some extent  Social Connections: Moderately Isolated (03/28/2023)   Social Connection and Isolation Panel [NHANES]    Frequency of Communication with Friends and Family: Twice a week    Frequency of Social Gatherings with  Friends and Family: Once a week    Attends Religious Services: More than 4 times per year    Active Member of Tabitha Mcconnell or Organizations: No    Attends Engineer, structural: Not on file    Marital Status: Never married  Intimate Partner Violence: Not on file    Review of Systems  All other systems reviewed and are negative.       Objective    BP (!) 137/95   Pulse 82   Temp 98.1 F (36.7 C) (Oral)   Resp 16   Wt 216 lb (98 kg)   SpO2 96%   BMI 39.51 kg/m   Physical Exam Vitals and nursing note reviewed.  Constitutional:      General: She is not in acute distress.    Appearance: She is obese.  Cardiovascular:     Rate and Rhythm: Normal rate and regular rhythm.  Pulmonary:     Effort: Pulmonary effort is normal.     Breath sounds: Normal breath sounds.  Abdominal:     Palpations: Abdomen is soft.     Tenderness: There is no abdominal tenderness.  Neurological:     General: No focal deficit present.     Mental Status: She is alert and oriented to person, place, and time.         Assessment & Plan:   Essential hypertension  Class 2 severe obesity due to excess calories with serious comorbidity and body mass index (BMI) of 39.0 to 39.9 in adult [facility])  Other orders -     hydroCHLOROthiazide; Take 1 capsule (12.5 mg total) by mouth daily.  Dispense: 90 capsule; Refill: 0 -     Wegovy ; Inject 0.5 mg into the skin once a week.  Dispense: 2 mL; Refill: 0   Slightly elevated readings. Will add hydrochlorothiazide 12.5 g daily to regimen and   Return in about 4 weeks (around 07/13/2023) for follow up, chronic med issues.   Tabitha Lama, MD

## 2023-07-13 ENCOUNTER — Ambulatory Visit (INDEPENDENT_AMBULATORY_CARE_PROVIDER_SITE_OTHER): Admitting: Family Medicine

## 2023-07-13 VITALS — BP 125/88 | HR 76 | Wt 209.4 lb

## 2023-07-13 DIAGNOSIS — I10 Essential (primary) hypertension: Secondary | ICD-10-CM

## 2023-07-13 DIAGNOSIS — Z6838 Body mass index (BMI) 38.0-38.9, adult: Secondary | ICD-10-CM

## 2023-07-13 DIAGNOSIS — Z7689 Persons encountering health services in other specified circumstances: Secondary | ICD-10-CM

## 2023-07-13 DIAGNOSIS — E66812 Obesity, class 2: Secondary | ICD-10-CM

## 2023-07-13 MED ORDER — WEGOVY 0.5 MG/0.5ML ~~LOC~~ SOAJ: 0.5000 mg | SUBCUTANEOUS | 0 refills | Status: DC

## 2023-07-13 NOTE — Progress Notes (Unsigned)
 Established Patient Office Visit  Subjective    Patient ID: Tabitha Mcconnell, female    DOB: 04/12/1975  Age: 48 y.o. MRN: [id number]  CC:  Chief Complaint  Patient presents with   Weight Management Screening   Hypertension    HPI Tabitha Mcconnell presents or follow up of hypertension as well as routine weight management. Patient reports  and denies acute complaints.   Outpatient Encounter Medications as of 07/13/2023  Medication Sig   hydrochlorothiazide  (MICROZIDE ) 12.5 MG capsule Take 1 capsule (12.5 mg total) by mouth daily.   lisinopril  (ZESTRIL ) 10 MG tablet Take 1 tablet (10 mg total) by mouth daily.   Semaglutide -Weight Management (WEGOVY ) 0.25 MG/0.5ML SOAJ INJECT 0.25MG  INTO THE SKIN ONE TIME PER WEEK   [DISCONTINUED] Semaglutide -Weight Management (WEGOVY ) 0.5 MG/0.5ML SOAJ Inject 0.5 mg into the skin once a week.   Semaglutide -Weight Management (WEGOVY ) 0.5 MG/0.5ML SOAJ Inject 0.5 mg into the skin once a week.   No facility-administered encounter medications on file as of 07/13/2023.    Past Medical History:  Diagnosis Date   Menorrhagia     Past Surgical History:  Procedure Laterality Date   HYSTEROSCOPY WITH D & C N/A 01/15/2017   Procedure: DILATATION AND CURETTAGE /HYSTEROSCOPY;  Surgeon: Lavoie, Marie-Lyne, MD;  Location: [facility];  Service: Gynecology;  Laterality: N/A;   HYSTEROSCOPY WITH NOVASURE N/A 01/15/2017   Procedure: HYSTEROSCOPY WITH NOVASURE;  Surgeon: Lavoie, Marie-Lyne, MD;  Location: [facility];  Service: Gynecology;  Laterality: N/A;  Requests 7:30am OR time.  Requests one hour   LAPAROSCOPIC CHOLECYSTECTOMY  12/2011   RESECTION COMPLEX MYXOID CYST RIGHT WRIST  07-22-2010    dr sypher   [facility]    Family History  Problem Relation Age of Onset   Parkinson's disease Mother    Heart disease Father    Breast cancer Paternal Aunt        died in 52's   Cancer Paternal Uncle        deceased   Epilepsy Son     Lymphoma Cousin     Social History   Socioeconomic History   Marital status: Single    Spouse name: Not on file   Number of children: Not on file   Years of education: Not on file   Highest education level: 12th grade  Occupational History   Not on file  Tobacco Use   Smoking status: Former    Current packs/day: 0.00    Types: Cigarettes    Start date: 01/14/1999    Quit date: 01/14/2015    Years since quitting: 8.5   Smokeless tobacco: Never  Vaping Use   Vaping status: Never Used  Substance and Sexual Activity   Alcohol use: Yes    Alcohol/week: 0.0 standard drinks of alcohol    Comment: occasional   Drug use: No   Sexual activity: Yes    Partners: Male    Birth control/protection: Condom    Comment: hx ablation, menarche 48yo, sexual debut 48yo  Other Topics Concern   Not on file  Social History Narrative   Not on file   Social Drivers of Health   Financial Resource Strain: Medium Risk (03/28/2023)   Overall Financial Resource Strain (CARDIA)    Difficulty of Paying Living Expenses: Somewhat hard  Food Insecurity: No Food Insecurity (03/28/2023)   Hunger Vital Sign    Worried About Running Out of Food in the Last Year: Never true    Ran Out  of Food in the Last Year: Never true  Transportation Needs: No Transportation Needs (03/28/2023)   PRAPARE - Administrator, Civil Service (Medical): No    Lack of Transportation (Non-Medical): No  Physical Activity: Insufficiently Active (03/28/2023)   Exercise Vital Sign    Days of Exercise per Week: 3 days    Minutes of Exercise per Session: 10 min  Stress: Stress Concern Present (03/28/2023)   [employer] of Occupational Health - Occupational Stress Questionnaire    Feeling of Stress : To some extent  Social Connections: Moderately Isolated (03/28/2023)   Social Connection and Isolation Panel    Frequency of Communication with Friends and Family: Twice a week    Frequency of Social Gatherings with Friends and  Family: Once a week    Attends Religious Services: More than 4 times per year    Active Member of [employer] or Organizations: No    Attends Engineer, structural: Not on file    Marital Status: Never married  Intimate Partner Violence: Not on file    Review of Systems  All other systems reviewed and are negative.       Objective    BP 125/88   Pulse 76   Wt 209 lb 6.4 oz (95 kg)   SpO2 97%   BMI 38.30 kg/m   Physical Exam Vitals and nursing note reviewed.  Constitutional:      General: She is not in acute distress.    Appearance: She is obese.   Cardiovascular:     Rate and Rhythm: Normal rate and regular rhythm.  Pulmonary:     Effort: Pulmonary effort is normal.     Breath sounds: Normal breath sounds.  Abdominal:     Palpations: Abdomen is soft.     Tenderness: There is no abdominal tenderness.   Neurological:     General: No focal deficit present.     Mental Status: She is alert and oriented to person, place, and time.         Assessment & Plan:  1. Essential hypertension (Primary) Appears stable. Continue   2. Encounter for weight management Wegovy  refilled. Patient desires to stay at this level for the time being.   3. Class 2 severe obesity due to excess calories with serious comorbidity and body mass index (BMI) of 38.0 to 38.9 in adult [facility])      Return in about 4 weeks (around 08/10/2023) for follow up, weight management.   Arlo Lama, MD

## 2023-07-15 ENCOUNTER — Encounter: Payer: Self-pay | Admitting: Family Medicine

## 2023-08-11 ENCOUNTER — Ambulatory Visit: Admitting: Family Medicine

## 2023-08-11 ENCOUNTER — Ambulatory Visit (INDEPENDENT_AMBULATORY_CARE_PROVIDER_SITE_OTHER): Admitting: Family Medicine

## 2023-08-11 ENCOUNTER — Encounter: Payer: Self-pay | Admitting: Family Medicine

## 2023-08-11 VITALS — BP 120/86 | HR 85 | Ht 62.0 in | Wt 206.4 lb

## 2023-08-11 DIAGNOSIS — Z7985 Long-term (current) use of injectable non-insulin antidiabetic drugs: Secondary | ICD-10-CM

## 2023-08-11 DIAGNOSIS — Z6837 Body mass index (BMI) 37.0-37.9, adult: Secondary | ICD-10-CM

## 2023-08-11 DIAGNOSIS — E66812 Obesity, class 2: Secondary | ICD-10-CM | POA: Diagnosis not present

## 2023-08-11 DIAGNOSIS — Z7689 Persons encountering health services in other specified circumstances: Secondary | ICD-10-CM

## 2023-08-11 DIAGNOSIS — R059 Cough, unspecified: Secondary | ICD-10-CM

## 2023-08-11 DIAGNOSIS — I10 Essential (primary) hypertension: Secondary | ICD-10-CM

## 2023-08-11 MED ORDER — WEGOVY 0.25 MG/0.5ML ~~LOC~~ SOAJ: 0.2500 mg | SUBCUTANEOUS | 0 refills | Status: DC

## 2023-08-11 MED ORDER — HYDROCHLOROTHIAZIDE 12.5 MG PO CAPS: 12.5000 mg | ORAL_CAPSULE | Freq: Every day | ORAL | 1 refills | Status: AC

## 2023-08-11 MED ORDER — LOSARTAN POTASSIUM 50 MG PO TABS: 50.0000 mg | ORAL_TABLET | Freq: Every day | ORAL | 0 refills | Status: DC

## 2023-08-11 MED ORDER — LISINOPRIL 10 MG PO TABS: 10.0000 mg | ORAL_TABLET | Freq: Every day | ORAL | 1 refills | Status: DC

## 2023-08-11 NOTE — Progress Notes (Addendum)
 Established Patient Office Visit  Subjective    Patient ID: Tabitha Mcconnell, female    DOB: Feb 17, 1975  Age: 48 y.o. MRN: [id number]  CC:  Chief Complaint  Patient presents with   Weight Check    Coughing at night    HPI Tabitha Mcconnell presents for routine follow up of hypertension and for weight management. Patient reports med management and reports she also is having a dry cough.    Outpatient Encounter Medications as of 08/11/2023  Medication Sig   losartan  (COZAAR ) 50 MG tablet Take 1 tablet (50 mg total) by mouth daily.   [DISCONTINUED] hydrochlorothiazide  (MICROZIDE ) 12.5 MG capsule Take 1 capsule (12.5 mg total) by mouth daily.   [DISCONTINUED] lisinopril  (ZESTRIL ) 10 MG tablet Take 1 tablet (10 mg total) by mouth daily.   [DISCONTINUED] Semaglutide -Weight Management (WEGOVY ) 0.25 MG/0.5ML SOAJ INJECT 0.25MG  INTO THE SKIN ONE TIME PER WEEK   hydrochlorothiazide  (MICROZIDE ) 12.5 MG capsule Take 1 capsule (12.5 mg total) by mouth daily.   Semaglutide -Weight Management (WEGOVY ) 0.25 MG/0.5ML SOAJ Inject 0.25 mg into the skin once a week.   [DISCONTINUED] lisinopril  (ZESTRIL ) 10 MG tablet Take 1 tablet (10 mg total) by mouth daily.   [DISCONTINUED] Semaglutide -Weight Management (WEGOVY ) 0.5 MG/0.5ML SOAJ Inject 0.5 mg into the skin once a week.   No facility-administered encounter medications on file as of 08/11/2023.    Past Medical History:  Diagnosis Date   Menorrhagia     Past Surgical History:  Procedure Laterality Date   HYSTEROSCOPY WITH D & C N/A 01/15/2017   Procedure: DILATATION AND CURETTAGE /HYSTEROSCOPY;  Surgeon: Lavoie, Marie-Lyne, MD;  Location:  [place];  Service: Gynecology;  Laterality: N/A;   HYSTEROSCOPY WITH NOVASURE N/A 01/15/2017   Procedure: HYSTEROSCOPY WITH NOVASURE;  Surgeon: Lavoie, Marie-Lyne, MD;  Location: [facility] ;  Service: Gynecology;  Laterality: N/A;  Requests 7:30am OR time.  Requests one hour    LAPAROSCOPIC CHOLECYSTECTOMY  12/2011   RESECTION COMPLEX MYXOID CYST RIGHT WRIST  07-22-2010    dr sypher   [facility]    Family History  Problem Relation Age of Onset   Parkinson's disease Mother    Heart disease Father    Breast cancer Paternal Aunt        died in 77's   Cancer Paternal Uncle        deceased   Epilepsy Son    Lymphoma Cousin     Social History   Socioeconomic History   Marital status: Single    Spouse name: Not on file   Number of children: Not on file   Years of education: Not on file   Highest education level: Never attended school  Occupational History   Not on file  Tobacco Use   Smoking status: Former    Current packs/day: 0.00    Types: Cigarettes    Start date: 01/14/1999    Quit date: 01/14/2015    Years since quitting: 8.5   Smokeless tobacco: Never  Vaping Use   Vaping status: Never Used  Substance and Sexual Activity   Alcohol use: Yes    Alcohol/week: 0.0 standard drinks of alcohol    Comment: occasional   Drug use: No   Sexual activity: Yes    Partners: Male    Birth control/protection: Condom    Comment: hx ablation, menarche 48yo, sexual debut 48yo  Other Topics Concern   Not on file  Social History Narrative   Not on file  Social Drivers of Health   Financial Resource Strain: Medium Risk (08/10/2023)   Overall Financial Resource Strain (CARDIA)    Difficulty of Paying Living Expenses: Somewhat hard  Food Insecurity: No Food Insecurity (08/10/2023)   Hunger Vital Sign    Worried About Running Out of Food in the Last Year: Never true    Ran Out of Food in the Last Year: Never true  Transportation Needs: No Transportation Needs (08/10/2023)   PRAPARE - Administrator, Civil Service (Medical): No    Lack of Transportation (Non-Medical): No  Physical Activity: Unknown (08/10/2023)   Exercise Vital Sign    Days of Exercise per Week: 2 days    Minutes of Exercise per Session: Patient declined  Stress: No Stress Concern  Present (08/10/2023)   [employer] of Occupational Health - Occupational Stress Questionnaire    Feeling of Stress: Only a little  Social Connections: Moderately Isolated (08/10/2023)   Social Connection and Isolation Panel    Frequency of Communication with Friends and Family: Twice a week    Frequency of Social Gatherings with Friends and Family: Twice a week    Attends Religious Services: 1 to 4 times per year    Active Member of [employer] or Organizations: No    Attends Engineer, structural: Not on file    Marital Status: Never married  Intimate Partner Violence: Not on file    Review of Systems  All other systems reviewed and are negative.       Objective    BP 120/86   Pulse 85   Ht 5' 2 (1.575 m)   Wt 206 lb 6.4 oz (93.6 kg)   SpO2 97%   BMI 37.75 kg/m   Physical Exam Vitals and nursing note reviewed.  Constitutional:      General: She is not in acute distress.    Appearance: She is obese.  Cardiovascular:     Rate and Rhythm: Normal rate and regular rhythm.  Pulmonary:     Effort: Pulmonary effort is normal.     Breath sounds: Normal breath sounds.  Abdominal:     Palpations: Abdomen is soft.     Tenderness: There is no abdominal tenderness.  Neurological:     General: No focal deficit present.     Mental Status: She is alert and oriented to person, place, and time.         Assessment & Plan:    1. Encounter for weight management (Primary) Wegovy  refilled. Patient staying at 0.25 mg weekly  2. Class 2 severe obesity due to excess calories with serious comorbidity and body mass index (BMI) of 37.0 to 37.9 in adult (HCC)   3. Essential hypertension Well controlled but will change from lisinopril  to losartan  2/2 cough  4. Cough, unspecified type Most likely 2/2 ACEI. As above   Return in about 4 weeks (around 09/08/2023) for follow up.   Tanda Raguel SQUIBB, MD

## 2023-08-12 ENCOUNTER — Ambulatory Visit: Admitting: Family Medicine

## 2023-08-18 ENCOUNTER — Encounter: Payer: Self-pay | Admitting: Nurse Practitioner

## 2023-08-18 ENCOUNTER — Ambulatory Visit (INDEPENDENT_AMBULATORY_CARE_PROVIDER_SITE_OTHER): Admitting: Nurse Practitioner

## 2023-08-18 VITALS — BP 130/90 | HR 97 | Ht 60.5 in | Wt 205.0 lb

## 2023-08-18 DIAGNOSIS — E785 Hyperlipidemia, unspecified: Secondary | ICD-10-CM

## 2023-08-18 DIAGNOSIS — Z01419 Encounter for gynecological examination (general) (routine) without abnormal findings: Secondary | ICD-10-CM

## 2023-08-18 DIAGNOSIS — Z1331 Encounter for screening for depression: Secondary | ICD-10-CM

## 2023-08-18 DIAGNOSIS — M62830 Muscle spasm of back: Secondary | ICD-10-CM

## 2023-08-18 DIAGNOSIS — E559 Vitamin D deficiency, unspecified: Secondary | ICD-10-CM

## 2023-08-18 DIAGNOSIS — K219 Gastro-esophageal reflux disease without esophagitis: Secondary | ICD-10-CM

## 2023-08-18 DIAGNOSIS — K644 Residual hemorrhoidal skin tags: Secondary | ICD-10-CM

## 2023-08-18 NOTE — Progress Notes (Signed)
 Tabitha Mcconnell [birth year] [id number]   History:  48 y.o. G1P1 presents for annual exam. Amenorrheic since endometrial ablation in 2018. Also had benign endometrial polyps at that time. Normal pap and mammogram history. Complains of acid reflux for a few months. Notices it mostly at night, sometimes burns and sometimes feels like something is in her throat. Thinks acid reflux started after starting Wegovy . Also having some constipation and hemorrhoids. Mostly itching, no bleeding. Has tried OTC preparation-H. HTN, weight management managed by PCP. Describes back symptoms as spasms, no pain, intermittent. Thinks it may be related to standing at work for long periods.   Gynecologic History No LMP recorded. Patient has had an ablation.   Contraception/Family planning: condoms Sexually active: Yes  Health Maintenance Last Pap: 04/19/2019. Results were: Normal neg HPV Last mammogram: 05/27/2023. Results were: Normal Last colonoscopy: Never. Negative Cologuard 05/22/2021 Last Dexa: 01/16/2008   Past medical history, past surgical history, family history and social history were all reviewed and documented in the EPIC chart. Single. Works for [employer]. 49 yo son, estranged.   ROS:  A ROS was performed and pertinent positives and negatives are included.  Exam:  Vitals:   08/18/23 1523  BP: (!) 130/90  Pulse: 97  SpO2: 99%  Weight: 205 lb (93 kg)  Height: 5' 0.5 (1.537 m)      Body mass index is 39.38 kg/m.  General appearance:  Normal Thyroid:  Symmetrical, normal in size, without palpable masses or nodularity. Respiratory  Auscultation:  Clear without wheezing or rhonchi Cardiovascular  Auscultation:  Regular rate, without rubs, murmurs or gallops  Edema/varicosities:  Not grossly evident Abdominal  Soft,nontender, without masses, guarding or rebound.  Liver/spleen:  No organomegaly noted  Hernia:  None appreciated  Skin  Inspection:  Grossly normal   Breasts: Examined lying and  sitting.   Right: Without masses, retractions, discharge or axillary adenopathy.   Left: Without masses, retractions, discharge or axillary adenopathy. Pelvic: External genitalia:  no lesions              Urethra:  normal appearing urethra with no masses, tenderness or lesions              Bartholins and Skenes: normal                 Vagina: normal appearing vagina with normal color and discharge, no lesions              Cervix: no lesions Bimanual Exam:  Uterus:  no masses or tenderness              Adnexa: no mass, fullness, tenderness              Rectovaginal: Deferred              Anus:  normal, no lesions  Elenor Mole, NP student performed exam with supervision.   Assessment/Plan:  48 y.o. G1P1 for annual exam.   Well female exam with routine gynecological exam - Plan: CBC with Differential/Platelet, Comprehensive metabolic panel. Education provided on SBEs, importance of preventative screenings, current guidelines, high calcium diet, regular exercise, and multivitamin daily. Will return for fasting labs.   External hemorrhoid - avoid constipation. Increase fluid and fiber intake, stool softeners, laxatives. Avoid sitting on toilet for more than 10 minutes. Makes sure OTC preparation-H has lidocaine  in it.   Gastroesophageal reflux disease, unspecified whether esophagitis present - OTC pepcid, start once per day. If no improvement will discuss with PCP.  Vitamin D  deficiency - Plan: VITAMIN D  25 Hydroxy (Vit-D Deficiency, Fractures). Not taking supplement.   Hyperlipidemia, unspecified hyperlipidemia type - Plan: Lipid panel  Back spasm - no pain, intermittent, mid back. Thinks it is from standing at work. Will follow up with PCP if it progresses.   Screening for cervical cancer - Normal Pap history.  Will repeat at 5-year interval per guidelines.  Screening for breast cancer - Normal mammogram history. Continue annual screenings. Normal breast exam today.  Screening for  colon cancer - Negative Cologuard 04/2021. Will repeat at 3-year interval per guidelines.   Return in about 1 year (around 08/17/2024) for Annual.      Tabitha DELENA Shutter DNP, 3:57 PM 08/18/2023

## 2023-08-18 NOTE — Patient Instructions (Signed)

## 2023-08-24 ENCOUNTER — Ambulatory Visit: Payer: Self-pay | Admitting: Nurse Practitioner

## 2023-08-24 ENCOUNTER — Other Ambulatory Visit: Payer: Self-pay | Admitting: Nurse Practitioner

## 2023-08-24 DIAGNOSIS — E559 Vitamin D deficiency, unspecified: Secondary | ICD-10-CM

## 2023-08-24 LAB — CBC WITH DIFFERENTIAL/PLATELET
Absolute Lymphocytes: 2380 {cells}/uL (ref 850–3900)
Absolute Monocytes: 700 {cells}/uL (ref 200–950)
Basophils Absolute: 42 {cells}/uL (ref 0–200)
Basophils Relative: 0.6 %
Eosinophils Absolute: 98 {cells}/uL (ref 15–500)
Eosinophils Relative: 1.4 %
HCT: 41.4 % (ref 35.0–45.0)
Hemoglobin: 13.5 g/dL (ref 11.7–15.5)
MCH: 30.1 pg (ref 27.0–33.0)
MCHC: 32.6 g/dL (ref 32.0–36.0)
MCV: 92.2 fL (ref 80.0–100.0)
MPV: 10.7 fL (ref 7.5–12.5)
Monocytes Relative: 10 %
Neutro Abs: 3780 {cells}/uL (ref 1500–7800)
Neutrophils Relative %: 54 %
Platelets: 267 Thousand/uL (ref 140–400)
RBC: 4.49 Million/uL (ref 3.80–5.10)
RDW: 13.1 % (ref 11.0–15.0)
Total Lymphocyte: 34 %
WBC: 7 Thousand/uL (ref 3.8–10.8)

## 2023-08-24 LAB — COMPREHENSIVE METABOLIC PANEL WITH GFR
AG Ratio: 1.5 (calc) (ref 1.0–2.5)
ALT: 12 U/L (ref 6–29)
AST: 15 U/L (ref 10–35)
Albumin: 3.9 g/dL (ref 3.6–5.1)
Alkaline phosphatase (APISO): 80 U/L (ref 31–125)
BUN: 9 mg/dL (ref 7–25)
CO2: 27 mmol/L (ref 20–32)
Calcium: 9.6 mg/dL (ref 8.6–10.2)
Chloride: 104 mmol/L (ref 98–110)
Creat: 0.77 mg/dL (ref 0.50–0.99)
Globulin: 2.6 g/dL (ref 1.9–3.7)
Glucose, Bld: 100 mg/dL — ABNORMAL HIGH (ref 65–99)
Potassium: 4.1 mmol/L (ref 3.5–5.3)
Sodium: 138 mmol/L (ref 135–146)
Total Bilirubin: 0.7 mg/dL (ref 0.2–1.2)
Total Protein: 6.5 g/dL (ref 6.1–8.1)
eGFR: 95 mL/min/1.73m2 (ref >=60)

## 2023-08-24 LAB — LIPID PANEL
Cholesterol: 156 mg/dL (ref <200)
HDL: 46 mg/dL — ABNORMAL LOW (ref >=50)
LDL Cholesterol (Calc): 94 mg/dL
Non-HDL Cholesterol (Calc): 110 mg/dL (ref <130)
Total CHOL/HDL Ratio: 3.4 (calc) (ref <5.0)
Triglycerides: 75 mg/dL (ref <150)

## 2023-08-24 LAB — VITAMIN D 25 HYDROXY (VIT D DEFICIENCY, FRACTURES): Vit D, 25-Hydroxy: 24 ng/mL — ABNORMAL LOW (ref 30–100)

## 2023-08-24 MED ORDER — VITAMIN D (ERGOCALCIFEROL) 1.25 MG (50000 UNIT) PO CAPS: 50000.0000 [IU] | ORAL_CAPSULE | ORAL | 0 refills | Status: AC

## 2023-09-08 ENCOUNTER — Ambulatory Visit (INDEPENDENT_AMBULATORY_CARE_PROVIDER_SITE_OTHER): Admitting: Family Medicine

## 2023-09-08 ENCOUNTER — Encounter: Payer: Self-pay | Admitting: Family Medicine

## 2023-09-08 VITALS — BP 135/85 | HR 91 | Ht 65.0 in | Wt 204.6 lb

## 2023-09-08 DIAGNOSIS — Z6834 Body mass index (BMI) 34.0-34.9, adult: Secondary | ICD-10-CM | POA: Diagnosis not present

## 2023-09-08 DIAGNOSIS — E6609 Other obesity due to excess calories: Secondary | ICD-10-CM | POA: Diagnosis not present

## 2023-09-08 DIAGNOSIS — E66811 Obesity, class 1: Secondary | ICD-10-CM

## 2023-09-08 DIAGNOSIS — Z7985 Long-term (current) use of injectable non-insulin antidiabetic drugs: Secondary | ICD-10-CM

## 2023-09-08 DIAGNOSIS — Z7689 Persons encountering health services in other specified circumstances: Secondary | ICD-10-CM

## 2023-09-08 MED ORDER — WEGOVY 0.5 MG/0.5ML ~~LOC~~ SOAJ: 0.5000 mg | SUBCUTANEOUS | 0 refills | Status: AC

## 2023-09-08 NOTE — Progress Notes (Signed)
 Established Patient Office Visit  Subjective    Patient ID: Tabitha Mcconnell, female    DOB: 1975/07/24  Age: 48 y.o. MRN: [id number]  CC:  Chief Complaint  Patient presents with   Medical Management of Chronic Issues    HPI Tabitha Mcconnell presents for routine weight loss management. Patient denies acute complaints.   Outpatient Encounter Medications as of 09/08/2023  Medication Sig   hydrochlorothiazide  (MICROZIDE ) 12.5 MG capsule Take 1 capsule (12.5 mg total) by mouth daily.   losartan  (COZAAR ) 50 MG tablet Take 1 tablet (50 mg total) by mouth daily.   Semaglutide -Weight Management (WEGOVY ) 0.25 MG/0.5ML SOAJ Inject 0.25 mg into the skin once a week.   semaglutide -weight management (WEGOVY ) 0.5 MG/0.5ML SOAJ SQ injection Inject 0.5 mg into the skin once a week.   Vitamin D , Ergocalciferol , (DRISDOL ) 1.25 MG (50000 UNIT) CAPS capsule Take 1 capsule (50,000 Units total) by mouth every 7 (seven) days for 12 doses.   No facility-administered encounter medications on file as of 09/08/2023.    Past Medical History:  Diagnosis Date   Menorrhagia     Past Surgical History:  Procedure Laterality Date   HYSTEROSCOPY WITH D & C N/A 01/15/2017   Procedure: DILATATION AND CURETTAGE /HYSTEROSCOPY;  Surgeon: Lavoie, Marie-Lyne, MD;  Location: [facility];  Service: Gynecology;  Laterality: N/A;   HYSTEROSCOPY WITH NOVASURE N/A 01/15/2017   Procedure: HYSTEROSCOPY WITH NOVASURE;  Surgeon: Lavoie, Marie-Lyne, MD;  Location: [facility];  Service: Gynecology;  Laterality: N/A;  Requests 7:30am OR time.  Requests one hour   LAPAROSCOPIC CHOLECYSTECTOMY  12/2011   RESECTION COMPLEX MYXOID CYST RIGHT WRIST  07-22-2010    dr sypher   [facility]    Family History  Problem Relation Age of Onset   Parkinson's disease Mother    Heart disease Father    Breast cancer Paternal Aunt        died in 51's   Cancer Paternal Uncle        deceased   Epilepsy Son    Lymphoma  Cousin     Social History   Socioeconomic History   Marital status: Single    Spouse name: Not on file   Number of children: Not on file   Years of education: Not on file   Highest education level: Never attended school  Occupational History   Not on file  Tobacco Use   Smoking status: Former    Current packs/day: 0.00    Types: Cigarettes    Start date: 01/14/1999    Quit date: 01/14/2015    Years since quitting: 8.6   Smokeless tobacco: Never  Vaping Use   Vaping status: Never Used  Substance and Sexual Activity   Alcohol use: Yes    Alcohol/week: 0.0 standard drinks of alcohol    Comment: occasional   Drug use: No   Sexual activity: Yes    Partners: Male    Birth control/protection: Condom    Comment: hx ablation, menarche 48yo, sexual debut 48yo  Other Topics Concern   Not on file  Social History Narrative   Not on file   Social Drivers of Health   Financial Resource Strain: Medium Risk (08/10/2023)   Overall Financial Resource Strain (CARDIA)    Difficulty of Paying Living Expenses: Somewhat hard  Food Insecurity: No Food Insecurity (08/10/2023)   Hunger Vital Sign    Worried About Running Out of Food in the Last Year: Never true  Ran Out of Food in the Last Year: Never true  Transportation Needs: No Transportation Needs (08/10/2023)   PRAPARE - Administrator, Civil Service (Medical): No    Lack of Transportation (Non-Medical): No  Physical Activity: Unknown (08/10/2023)   Exercise Vital Sign    Days of Exercise per Week: 2 days    Minutes of Exercise per Session: Patient declined  Stress: No Stress Concern Present (08/10/2023)   [employer] of Occupational Health - Occupational Stress Questionnaire    Feeling of Stress: Only a little  Social Connections: Moderately Isolated (08/10/2023)   Social Connection and Isolation Panel    Frequency of Communication with Friends and Family: Twice a week    Frequency of Social Gatherings with  Friends and Family: Twice a week    Attends Religious Services: 1 to 4 times per year    Active Member of [employer] or Organizations: No    Attends Engineer, structural: Not on file    Marital Status: Never married  Intimate Partner Violence: Not on file    Review of Systems  All other systems reviewed and are negative.       Objective    BP 135/85   Pulse 91   Ht 5' 5 (1.651 m)   Wt 204 lb 9.6 oz (92.8 kg)   SpO2 96%   BMI 34.05 kg/m   Physical Exam Vitals and nursing note reviewed.  Constitutional:      General: She is not in acute distress.    Appearance: She is obese.  Cardiovascular:     Rate and Rhythm: Normal rate and regular rhythm.  Pulmonary:     Effort: Pulmonary effort is normal.     Breath sounds: Normal breath sounds.  Abdominal:     Palpations: Abdomen is soft.     Tenderness: There is no abdominal tenderness.  Neurological:     General: No focal deficit present.     Mental Status: She is alert and oriented to person, place, and time.         Assessment & Plan:   Encounter for weight management  Class 1 obesity due to excess calories with serious comorbidity and body mass index (BMI) of 34.0 to 34.9 in adult  Other orders -     Wegovy ; Inject 0.5 mg into the skin once a week.  Dispense: 2 mL; Refill: 0     No follow-ups on file.   Tabitha Raguel SQUIBB, MD

## 2023-09-09 ENCOUNTER — Encounter: Payer: Self-pay | Admitting: Family Medicine

## 2023-10-05 ENCOUNTER — Other Ambulatory Visit: Payer: Self-pay | Admitting: Family Medicine

## 2023-10-05 NOTE — Telephone Encounter (Unsigned)
 Copied from CRM [id number]. Topic: Clinical - Medication Refill >> Oct 05, 2023  7:51 AM Montie POUR wrote: Medication: Semaglutide -Weight Management (WEGOVY ) 0.25 MG/0.5ML SOAJ - She needs to take medication on 10/09/23. She is out of medication.   Has the patient contacted their pharmacy? Yes (Agent: If no, request that the patient contact the pharmacy for the refill. If patient does not wish to contact the pharmacy document the reason why and proceed with request.) (Agent: If yes, when and what did the pharmacy advise?) Pharmacy needs order to refill  This is the patient's preferred pharmacy:  [id number] [place], [place] - [address] [place]  [address] Phone: [phone] Fax: [phone]  Is this the correct pharmacy for this prescription? Yes If no, delete pharmacy and type the correct one.   Has the prescription been filled recently? No  Is the patient out of the medication? Yes  Has the patient been seen for an appointment in the last year OR does the patient have an upcoming appointment? Yes  Can we respond through MyChart? Yes  Agent: Please be advised that Rx refills may take up to 3 business days. We ask that you follow-up with your pharmacy.

## 2023-10-06 MED ORDER — WEGOVY 0.25 MG/0.5ML ~~LOC~~ SOAJ: 0.2500 mg | SUBCUTANEOUS | 2 refills | Status: DC

## 2023-10-06 NOTE — Telephone Encounter (Signed)
 Requested Prescriptions  Pending Prescriptions Disp Refills   semaglutide -weight management (WEGOVY ) 0.25 MG/0.5ML SOAJ SQ injection 2 mL 2    Sig: Inject 0.25 mg into the skin once a week.     Endocrinology:  Diabetes - GLP-1 Receptor Agonists - semaglutide  Failed - 10/06/2023 10:51 AM      Failed - HBA1C in normal range and within 180 days    Hgb A1c MFr Bld  Date Value Ref Range Status  04/19/2019 5.6 <5.7 % of total Hgb Final    Comment:    For the purpose of screening for the presence of diabetes: . <5.7%       Consistent with the absence of diabetes 5.7-6.4%    Consistent with increased risk for diabetes             (prediabetes) > or =6.5%  Consistent with diabetes . This assay result is consistent with a decreased risk of diabetes. . Currently, no consensus exists regarding use of hemoglobin A1c for diagnosis of diabetes in children. . According to American Diabetes Association (ADA) guidelines, hemoglobin A1c <7.0% represents optimal control in non-pregnant diabetic patients. Different metrics may apply to specific patient populations.  Standards of Medical Care in Diabetes(ADA). .          Passed - Cr in normal range and within 360 days    Creat  Date Value Ref Range Status  08/23/2023 0.77 0.50 - 0.99 mg/dL Final         Passed - Valid encounter within last 6 months    Recent Outpatient Visits           4 weeks ago Encounter for weight management   [facility], MD   1 month ago Encounter for weight management   [facility], MD   2 months ago Essential hypertension   [facility], MD   3 months ago Essential hypertension   [facility], MD   4 months ago Uncontrolled hypertension   [facility], MD

## 2023-10-14 ENCOUNTER — Other Ambulatory Visit: Payer: Self-pay

## 2023-10-25 ENCOUNTER — Ambulatory Visit: Payer: Self-pay | Admitting: Family Medicine

## 2023-11-08 ENCOUNTER — Other Ambulatory Visit: Payer: Self-pay | Admitting: Nurse Practitioner

## 2023-11-08 DIAGNOSIS — E559 Vitamin D deficiency, unspecified: Secondary | ICD-10-CM

## 2023-11-08 NOTE — Telephone Encounter (Signed)
 Med refill request: vitamin d2 Last AEX: 08/18/23 Next AEX: not scheduled  Last MMG (if hormonal med)  Refill authorized: last rx 08/24/23 #12 with 0 refills. Please approve or deny

## 2023-11-09 ENCOUNTER — Other Ambulatory Visit: Payer: Self-pay | Admitting: Family Medicine

## 2023-11-29 ENCOUNTER — Encounter: Payer: Self-pay | Admitting: Radiology

## 2023-12-08 ENCOUNTER — Ambulatory Visit (INDEPENDENT_AMBULATORY_CARE_PROVIDER_SITE_OTHER): Payer: Self-pay | Admitting: Family Medicine

## 2023-12-08 ENCOUNTER — Encounter: Payer: Self-pay | Admitting: Family Medicine

## 2023-12-08 VITALS — BP 138/93 | HR 84 | Ht 65.0 in | Wt 206.2 lb

## 2023-12-08 DIAGNOSIS — M25522 Pain in left elbow: Secondary | ICD-10-CM | POA: Diagnosis not present

## 2023-12-08 DIAGNOSIS — I10 Essential (primary) hypertension: Secondary | ICD-10-CM

## 2023-12-08 MED ORDER — DICLOFENAC SODIUM 1 % EX GEL: 2.0000 g | Freq: Four times a day (QID) | CUTANEOUS | 0 refills | Status: AC

## 2023-12-13 ENCOUNTER — Encounter: Payer: Self-pay | Admitting: Family Medicine

## 2023-12-13 NOTE — Progress Notes (Signed)
 Established Patient Office Visit  Subjective    Patient ID: Tabitha Mcconnell, female    DOB: 1975-04-26  Age: 48 y.o. MRN: [id number]  CC:  Chief Complaint  Patient presents with   Medical Management of Chronic Issues    Pt reports pain in left elbow for about a month     HPI Tabitha Mcconnell presents for left elbow pain for about  1 month. She denies known trauma or injury. Patient is right hand dominant. She is using no med for sx.   Outpatient Encounter Medications as of 12/08/2023  Medication Sig   diclofenac Sodium (VOLTAREN) 1 % GEL Apply 2 g topically 4 (four) times daily.   hydrochlorothiazide  (MICROZIDE ) 12.5 MG capsule Take 1 capsule (12.5 mg total) by mouth daily.   losartan  (COZAAR ) 50 MG tablet TAKE 1 TABLET BY MOUTH EVERY DAY   semaglutide -weight management (WEGOVY ) 0.25 MG/0.5ML SOAJ SQ injection Inject 0.25 mg into the skin once a week. (Patient not taking: Reported on 12/08/2023)   semaglutide -weight management (WEGOVY ) 0.5 MG/0.5ML SOAJ SQ injection Inject 0.5 mg into the skin once a week. (Patient not taking: Reported on 12/08/2023)   No facility-administered encounter medications on file as of 12/08/2023.    Past Medical History:  Diagnosis Date   Menorrhagia     Past Surgical History:  Procedure Laterality Date   HYSTEROSCOPY WITH D & C N/A 01/15/2017   Procedure: DILATATION AND CURETTAGE /HYSTEROSCOPY;  Surgeon: Lavoie, Marie-Lyne, MD;  Location: [facility];  Service: Gynecology;  Laterality: N/A;   HYSTEROSCOPY WITH NOVASURE N/A 01/15/2017   Procedure: HYSTEROSCOPY WITH NOVASURE;  Surgeon: Lavoie, Marie-Lyne, MD;  Location: [facility];  Service: Gynecology;  Laterality: N/A;  Requests 7:30am OR time.  Requests one hour   LAPAROSCOPIC CHOLECYSTECTOMY  12/2011   RESECTION COMPLEX MYXOID CYST RIGHT WRIST  07-22-2010    dr sypher   [facility]    Family History  Problem Relation Age of Onset   Parkinson's disease Mother    Heart  disease Father    Breast cancer Paternal Aunt        died in 75's   Cancer Paternal Uncle        deceased   Epilepsy Son    Lymphoma Cousin     Social History   Socioeconomic History   Marital status: Single    Spouse name: Not on file   Number of children: Not on file   Years of education: Not on file   Highest education level: Never attended school  Occupational History   Not on file  Tobacco Use   Smoking status: Former    Current packs/day: 0.00    Types: Cigarettes    Start date: 01/14/1999    Quit date: 01/14/2015    Years since quitting: 8.9   Smokeless tobacco: Never  Vaping Use   Vaping status: Never Used  Substance and Sexual Activity   Alcohol use: Yes    Alcohol/week: 0.0 standard drinks of alcohol    Comment: occasional   Drug use: No   Sexual activity: Yes    Partners: Male    Birth control/protection: Condom    Comment: hx ablation, menarche 48yo, sexual debut 48yo  Other Topics Concern   Not on file  Social History Narrative   Not on file   Social Drivers of Health   Financial Resource Strain: Medium Risk (08/10/2023)   Overall Financial Resource Strain (CARDIA)    Difficulty of Paying Living  Expenses: Somewhat hard  Food Insecurity: No Food Insecurity (08/10/2023)   Hunger Vital Sign    Worried About Running Out of Food in the Last Year: Never true    Ran Out of Food in the Last Year: Never true  Transportation Needs: No Transportation Needs (08/10/2023)   PRAPARE - Administrator, Civil Service (Medical): No    Lack of Transportation (Non-Medical): No  Physical Activity: Unknown (08/10/2023)   Exercise Vital Sign    Days of Exercise per Week: 2 days    Minutes of Exercise per Session: Patient declined  Stress: No Stress Concern Present (08/10/2023)   [employer] of Occupational Health - Occupational Stress Questionnaire    Feeling of Stress: Only a little  Social Connections: Moderately Isolated (08/10/2023)   Social  Connection and Isolation Panel    Frequency of Communication with Friends and Family: Twice a week    Frequency of Social Gatherings with Friends and Family: Twice a week    Attends Religious Services: 1 to 4 times per year    Active Member of [employer] or Organizations: No    Attends Engineer, Structural: Not on file    Marital Status: Never married  Intimate Partner Violence: Not At Risk (12/08/2023)   Humiliation, Afraid, Rape, and Kick questionnaire    Fear of Current or Ex-Partner: No    Emotionally Abused: No    Physically Abused: No    Sexually Abused: No    Review of Systems  All other systems reviewed and are negative.       Objective    BP (!) 138/93   Pulse 84   Ht 5' 5 (1.651 m)   Wt 206 lb 3.2 oz (93.5 kg)   SpO2 97%   BMI 34.31 kg/m   Physical Exam Vitals and nursing note reviewed.  Constitutional:      General: She is not in acute distress.    Appearance: She is obese.  Cardiovascular:     Rate and Rhythm: Normal rate and regular rhythm.  Pulmonary:     Effort: Pulmonary effort is normal.     Breath sounds: Normal breath sounds.  Musculoskeletal:     Left elbow: No deformity. Tenderness present.  Neurological:     General: No focal deficit present.     Mental Status: She is alert and oriented to person, place, and time.         Assessment & Plan:   Left elbow pain  Essential hypertension  Other orders -     Diclofenac Sodium; Apply 2 g topically 4 (four) times daily.  Dispense: 350 g; Refill: 0   Patient deferred further eval of elbow at this time  Return if symptoms worsen or fail to improve.   Tanda Raguel SQUIBB, MD

## 2024-02-08 ENCOUNTER — Encounter: Payer: Self-pay | Admitting: Family Medicine

## 2024-02-08 ENCOUNTER — Ambulatory Visit: Admitting: Family Medicine

## 2024-02-08 VITALS — BP 141/96 | HR 83 | Ht 65.0 in | Wt 209.6 lb

## 2024-02-08 DIAGNOSIS — Z6834 Body mass index (BMI) 34.0-34.9, adult: Secondary | ICD-10-CM | POA: Diagnosis not present

## 2024-02-08 DIAGNOSIS — Z7689 Persons encountering health services in other specified circumstances: Secondary | ICD-10-CM

## 2024-02-08 DIAGNOSIS — E6609 Other obesity due to excess calories: Secondary | ICD-10-CM | POA: Diagnosis not present

## 2024-02-08 DIAGNOSIS — E66811 Obesity, class 1: Secondary | ICD-10-CM

## 2024-02-08 DIAGNOSIS — I10 Essential (primary) hypertension: Secondary | ICD-10-CM

## 2024-02-08 MED ORDER — MELOXICAM 15 MG PO TABS: 15.0000 mg | ORAL_TABLET | Freq: Every day | ORAL | 1 refills | Status: AC

## 2024-02-08 MED ORDER — WEGOVY 0.25 MG/0.5ML ~~LOC~~ SOAJ: 0.2500 mg | SUBCUTANEOUS | 0 refills | Status: AC

## 2024-02-09 ENCOUNTER — Encounter: Payer: Self-pay | Admitting: Family Medicine

## 2024-02-09 NOTE — Progress Notes (Signed)
 "  Established Patient Office Visit  Subjective    Patient ID: Tabitha Mcconnell, female    DOB: 03-13-1975  Age: 49 y.o. MRN: [id number]  CC:  Chief Complaint  Patient presents with   Medical Management of Chronic Issues    HPI Tabitha Mcconnell presents for routine weight management and follow up of hypertension. Patient reports med compliance and denies acute complaints.   Outpatient Encounter Medications as of 02/08/2024  Medication Sig   hydrochlorothiazide  (MICROZIDE ) 12.5 MG capsule Take 1 capsule (12.5 mg total) by mouth daily.   losartan  (COZAAR ) 50 MG tablet TAKE 1 TABLET BY MOUTH EVERY DAY   meloxicam  (MOBIC ) 15 MG tablet Take 1 tablet (15 mg total) by mouth daily.   diclofenac  Sodium (VOLTAREN ) 1 % GEL Apply 2 g topically 4 (four) times daily. (Patient not taking: Reported on 02/08/2024)   semaglutide -weight management (WEGOVY ) 0.25 MG/0.5ML SOAJ SQ injection Inject 0.25 mg into the skin once a week.   semaglutide -weight management (WEGOVY ) 0.5 MG/0.5ML SOAJ SQ injection Inject 0.5 mg into the skin once a week. (Patient not taking: Reported on 02/08/2024)   [DISCONTINUED] semaglutide -weight management (WEGOVY ) 0.25 MG/0.5ML SOAJ SQ injection Inject 0.25 mg into the skin once a week. (Patient not taking: Reported on 02/08/2024)   No facility-administered encounter medications on file as of 02/08/2024.    Past Medical History:  Diagnosis Date   Menorrhagia     Past Surgical History:  Procedure Laterality Date   HYSTEROSCOPY WITH D & C N/A 01/15/2017   Procedure: DILATATION AND CURETTAGE /HYSTEROSCOPY;  Surgeon: Lavoie, Marie-Lyne, MD;  Location: [facility];  Service: Gynecology;  Laterality: N/A;   HYSTEROSCOPY WITH NOVASURE N/A 01/15/2017   Procedure: HYSTEROSCOPY WITH NOVASURE;  Surgeon: Lavoie, Marie-Lyne, MD;  Location: [facility];  Service: Gynecology;  Laterality: N/A;  Requests 7:30am OR time.  Requests one hour   LAPAROSCOPIC  CHOLECYSTECTOMY  12/2011   RESECTION COMPLEX MYXOID CYST RIGHT WRIST  07-22-2010    dr sypher   [facility]    Family History  Problem Relation Age of Onset   Parkinson's disease Mother    Heart disease Father    Breast cancer Paternal Aunt        died in 34's   Cancer Paternal Uncle        deceased   Epilepsy Son    Lymphoma Cousin     Social History   Socioeconomic History   Marital status: Single    Spouse name: Not on file   Number of children: Not on file   Years of education: Not on file   Highest education level: Never attended school  Occupational History   Not on file  Tobacco Use   Smoking status: Former    Current packs/day: 0.00    Types: Cigarettes    Start date: 01/14/1999    Quit date: 01/14/2015    Years since quitting: 9.0   Smokeless tobacco: Never  Vaping Use   Vaping status: Never Used  Substance and Sexual Activity   Alcohol use: Yes    Alcohol/week: 0.0 standard drinks of alcohol    Comment: occasional   Drug use: No   Sexual activity: Yes    Partners: Male    Birth control/protection: Condom    Comment: hx ablation, menarche 49yo, sexual debut 49yo  Other Topics Concern   Not on file  Social History Narrative   Not on file   Social Drivers of Health   Tobacco  Use: Medium Risk (02/08/2024)   Patient History    Smoking Tobacco Use: Former    Smokeless Tobacco Use: Never    Passive Exposure: Not on file  Financial Resource Strain: Medium Risk (08/10/2023)   Overall Financial Resource Strain (CARDIA)    Difficulty of Paying Living Expenses: Somewhat hard  Food Insecurity: No Food Insecurity (08/10/2023)   Epic    Worried About Programme Researcher, Broadcasting/film/video in the Last Year: Never true    Ran Out of Food in the Last Year: Never true  Transportation Needs: No Transportation Needs (08/10/2023)   Epic    Lack of Transportation (Medical): No    Lack of Transportation (Non-Medical): No  Physical Activity: Unknown (08/10/2023)   Exercise Vital Sign    Days  of Exercise per Week: 2 days    Minutes of Exercise per Session: Patient declined  Stress: No Stress Concern Present (08/10/2023)   [employer] of Occupational Health - Occupational Stress Questionnaire    Feeling of Stress: Only a little  Social Connections: Moderately Isolated (08/10/2023)   Social Connection and Isolation Panel    Frequency of Communication with Friends and Family: Twice a week    Frequency of Social Gatherings with Friends and Family: Twice a week    Attends Religious Services: 1 to 4 times per year    Active Member of [employer] or Organizations: No    Attends Engineer, Structural: Not on file    Marital Status: Never married  Intimate Partner Violence: Not At Risk (12/08/2023)   Epic    Fear of Current or Ex-Partner: No    Emotionally Abused: No    Physically Abused: No    Sexually Abused: No  Depression (PHQ2-9): Low Risk (08/18/2023)   Depression (PHQ2-9)    PHQ-2 Score: 4  Recent Concern: Depression (PHQ2-9) - Medium Risk (07/13/2023)   Depression (PHQ2-9)    PHQ-2 Score: 5  Alcohol Screen: Low Risk (08/10/2023)   Alcohol Screen    Last Alcohol Screening Score (AUDIT): 2  Housing: Low Risk (08/10/2023)   Epic    Unable to Pay for Housing in the Last Year: No    Number of Times Moved in the Last Year: 1    Homeless in the Last Year: No  Utilities: Not At Risk (03/29/2023)   [facility] Utilities    Threatened with loss of utilities: No  Health Literacy: Adequate Health Literacy (03/29/2023)   B1300 Health Literacy    Frequency of need for help with medical instructions: Never    Review of Systems  All other systems reviewed and are negative.       Objective    BP (!) 141/96   Pulse 83   Ht 5' 5 (1.651 m)   Wt 209 lb 9.6 oz (95.1 kg)   SpO2 96%   BMI 34.88 kg/m   Physical Exam Vitals and nursing note reviewed.  Constitutional:      General: She is not in acute distress.    Appearance: She is obese.  Cardiovascular:     Rate and Rhythm:  Normal rate and regular rhythm.  Pulmonary:     Effort: Pulmonary effort is normal.     Breath sounds: Normal breath sounds.  Abdominal:     Palpations: Abdomen is soft.     Tenderness: There is no abdominal tenderness.  Neurological:     General: No focal deficit present.     Mental Status: She is alert and oriented to person, place, and  time.         Assessment & Plan:  1. Encounter for weight management (Primary) Wegovy  prescribed.   2. Class 1 obesity due to excess calories with serious comorbidity and body mass index (BMI) of 34.0 to 34.9 in adult   3. Essential hypertension Slightly elevated readings. Continue   Return in about 4 weeks (around 03/07/2024) for weight management.   Tanda Raguel SQUIBB, MD  "

## 2024-02-15 ENCOUNTER — Encounter: Payer: Self-pay | Admitting: Family Medicine

## 2024-02-15 ENCOUNTER — Other Ambulatory Visit: Payer: Self-pay | Admitting: Family Medicine

## 2024-02-15 MED ORDER — LOSARTAN POTASSIUM 50 MG PO TABS: 50.0000 mg | ORAL_TABLET | Freq: Every day | ORAL | 0 refills | Status: AC

## 2024-03-07 ENCOUNTER — Ambulatory Visit: Payer: Self-pay | Admitting: Family Medicine
